# Patient Record
Sex: Male | Born: 1945 | Hispanic: No | Marital: Married | Smoking: Never smoker
Health system: Southern US, Community
[De-identification: ages and names within clinical notes are randomized; demographics above are authoritative.]

## PROBLEM LIST (undated history)

## (undated) DIAGNOSIS — I10 Essential (primary) hypertension: Secondary | ICD-10-CM

## (undated) DIAGNOSIS — E119 Type 2 diabetes mellitus without complications: Secondary | ICD-10-CM

## (undated) DIAGNOSIS — G43909 Migraine, unspecified, not intractable, without status migrainosus: Secondary | ICD-10-CM

## (undated) DIAGNOSIS — N451 Epididymitis: Secondary | ICD-10-CM

## (undated) DIAGNOSIS — F4310 Post-traumatic stress disorder, unspecified: Secondary | ICD-10-CM

## (undated) DIAGNOSIS — E7800 Pure hypercholesterolemia, unspecified: Secondary | ICD-10-CM

## (undated) HISTORY — PX: CATARACT EXTRACTION: SUR2

## (undated) HISTORY — PX: INGUINAL HERNIA REPAIR: SUR1180

## (undated) HISTORY — PX: TONSILLECTOMY: SUR1361

## (undated) HISTORY — PX: PROSTATE BIOPSY: SHX241

---

## 2007-12-15 HISTORY — PX: LUMBAR SPINE SURGERY: SHX701

## 2013-12-14 HISTORY — PX: LUMBAR SPINE SURGERY: SHX701

## 2018-02-13 DIAGNOSIS — N451 Epididymitis: Secondary | ICD-10-CM

## 2018-02-13 HISTORY — DX: Epididymitis: N45.1

## 2018-04-04 ENCOUNTER — Emergency Department (HOSPITAL_BASED_OUTPATIENT_CLINIC_OR_DEPARTMENT_OTHER): Payer: Medicare Other

## 2018-04-04 ENCOUNTER — Emergency Department (HOSPITAL_BASED_OUTPATIENT_CLINIC_OR_DEPARTMENT_OTHER)
Admission: EM | Admit: 2018-04-04 | Discharge: 2018-04-04 | Disposition: A | Discharge status: Home / Self Care | Payer: Medicare Other | Attending: Emergency Medicine | Admitting: Emergency Medicine

## 2018-04-04 ENCOUNTER — Other Ambulatory Visit: Payer: Self-pay

## 2018-04-04 ENCOUNTER — Encounter (HOSPITAL_BASED_OUTPATIENT_CLINIC_OR_DEPARTMENT_OTHER): Payer: Self-pay | Admitting: *Deleted

## 2018-04-04 DIAGNOSIS — I10 Essential (primary) hypertension: Secondary | ICD-10-CM | POA: Diagnosis not present

## 2018-04-04 DIAGNOSIS — Z7984 Long term (current) use of oral hypoglycemic drugs: Secondary | ICD-10-CM | POA: Diagnosis not present

## 2018-04-04 DIAGNOSIS — E119 Type 2 diabetes mellitus without complications: Secondary | ICD-10-CM | POA: Diagnosis not present

## 2018-04-04 DIAGNOSIS — R1032 Left lower quadrant pain: Secondary | ICD-10-CM | POA: Insufficient documentation

## 2018-04-04 DIAGNOSIS — R1030 Lower abdominal pain, unspecified: Secondary | ICD-10-CM | POA: Diagnosis present

## 2018-04-04 DIAGNOSIS — Z79899 Other long term (current) drug therapy: Secondary | ICD-10-CM | POA: Insufficient documentation

## 2018-04-04 HISTORY — DX: Type 2 diabetes mellitus without complications: E11.9

## 2018-04-04 HISTORY — DX: Epididymitis: N45.1

## 2018-04-04 HISTORY — DX: Pure hypercholesterolemia, unspecified: E78.00

## 2018-04-04 HISTORY — DX: Essential (primary) hypertension: I10

## 2018-04-04 LAB — URINALYSIS, ROUTINE W REFLEX MICROSCOPIC
BILIRUBIN URINE: NEGATIVE
GLUCOSE, UA: NEGATIVE mg/dL
Hgb urine dipstick: NEGATIVE
KETONES UR: NEGATIVE mg/dL
Leukocytes, UA: NEGATIVE
Nitrite: NEGATIVE
Protein, ur: NEGATIVE mg/dL
Specific Gravity, Urine: 1.02 (ref 1.005–1.030)
pH: 6 (ref 5.0–8.0)

## 2018-04-04 LAB — COMPREHENSIVE METABOLIC PANEL
ALT: 30 U/L (ref 17–63)
ANION GAP: 8 (ref 5–15)
AST: 30 U/L (ref 15–41)
Albumin: 4 g/dL (ref 3.5–5.0)
Alkaline Phosphatase: 61 U/L (ref 38–126)
BILIRUBIN TOTAL: 0.5 mg/dL (ref 0.3–1.2)
BUN: 14 mg/dL (ref 6–20)
CO2: 24 mmol/L (ref 22–32)
Calcium: 9.4 mg/dL (ref 8.9–10.3)
Chloride: 102 mmol/L (ref 101–111)
Creatinine, Ser: 0.81 mg/dL (ref 0.61–1.24)
GLUCOSE: 133 mg/dL — AB (ref 65–99)
POTASSIUM: 3.4 mmol/L — AB (ref 3.5–5.1)
Sodium: 134 mmol/L — ABNORMAL LOW (ref 135–145)
TOTAL PROTEIN: 6.6 g/dL (ref 6.5–8.1)

## 2018-04-04 LAB — CBC WITH DIFFERENTIAL/PLATELET
BASOS PCT: 0 %
Basophils Absolute: 0 10*3/uL (ref 0.0–0.1)
Eosinophils Absolute: 0.1 10*3/uL (ref 0.0–0.7)
Eosinophils Relative: 1 %
HEMATOCRIT: 37.2 % — AB (ref 39.0–52.0)
Hemoglobin: 13 g/dL (ref 13.0–17.0)
LYMPHS ABS: 1.8 10*3/uL (ref 0.7–4.0)
Lymphocytes Relative: 24 %
MCH: 30.4 pg (ref 26.0–34.0)
MCHC: 34.9 g/dL (ref 30.0–36.0)
MCV: 87.1 fL (ref 78.0–100.0)
MONO ABS: 0.9 10*3/uL (ref 0.1–1.0)
MONOS PCT: 13 %
NEUTROS ABS: 4.6 10*3/uL (ref 1.7–7.7)
Neutrophils Relative %: 62 %
Platelets: 198 10*3/uL (ref 150–400)
RBC: 4.27 MIL/uL (ref 4.22–5.81)
RDW: 13.9 % (ref 11.5–15.5)
WBC: 7.3 10*3/uL (ref 4.0–10.5)

## 2018-04-04 NOTE — ED Provider Notes (Signed)
[facility] EMERGENCY DEPARTMENT Provider Note   CSN: [id number] Arrival date & time: 04/04/18  1341     History   Chief Complaint Chief Complaint  Patient presents with  . Groin Pain    HPI Arthur Spencer is a 72 y.o. male.  HPI   Arthur Spencer is a 72 y.o. male, with a history of DM and HTN, presenting to the ED with left testicular pain beginning about a week and a half ago.  Pain shoots from the left inguinal region into the left testicle and vice versa, intermittent, ranges from 3/10-5/10, arises with movement, getting up out of bed in the morning, or lifting.  Patient had similar pain around February 13, 2018.  Epididymitis was diagnosed via ultrasound and patient was treated with "10-14 days of antibiotics of some sort." Patient recently moved from [place] and has been lifting boxes for the past couple weeks.  States, "I do not know if this is a recurrence of the epididymitis or if I hurt myself lifting, like a hernia."  He does endorse some intermittent pain with bowel movements.  Denies difficulty urinating, dysuria, hematuria, penile discharge, nausea/vomiting, abdominal pain, hematochezia/melena, or any other complaints.  Past Medical History:  Diagnosis Date  . Diabetes mellitus without complication (HCC)   . Epididymitis 02/13/2018  . High cholesterol   . Hypertension     There are no active problems to display for this patient.   Past Surgical History:  Procedure Laterality Date  . INGUINAL HERNIA REPAIR     As infant  . LUMBAR SPINE SURGERY  2009   discectomy  . LUMBAR SPINE SURGERY  2015   enlarged nerve canal   . TONSILLECTOMY          Home Medications    Prior to Admission medications   Medication Sig Start Date End Date Taking? Authorizing Provider  ezetimibe (ZETIA) 10 MG tablet Take 10 mg by mouth daily.   Yes [provider]  hydrochlorothiazide (MICROZIDE) 12.5 MG capsule Take 12.5 mg by mouth daily.   Yes [provider]  lisinopril (PRINIVIL,ZESTRIL) 10 MG tablet Take 10 mg by mouth daily.   Yes [provider]  metFORMIN (GLUCOPHAGE) 500 MG tablet Take by mouth 2 (two) times daily with a meal.   Yes [provider]    Family History No family history on file.  Social History Social History   Tobacco Use  . Smoking status: Never Smoker  . Smokeless tobacco: Never Used  Substance Use Topics  . Alcohol use: Not Currently    Frequency: Never  . Drug use: Never     Allergies   Patient has no known allergies.   Review of Systems Review of Systems  Constitutional: Negative for chills and fever.  Gastrointestinal: Negative for abdominal pain, blood in stool, nausea and vomiting.  Genitourinary: Positive for testicular pain. Negative for difficulty urinating, discharge, dysuria, frequency, genital sores, hematuria, penile swelling and scrotal swelling.  Musculoskeletal: Negative for back pain.  All other systems reviewed and are negative.    Physical Exam Updated Vital Signs BP (!) 172/66   Pulse 60   Temp 98 F (36.7 C) (Oral)   Resp 20   Ht 6\' 1"  (1.854 m)   Wt 106.6 kg (235 lb)   SpO2 98%   BMI 31.00 kg/m   Physical Exam  Constitutional: He appears well-developed and well-nourished. No distress.  HENT:  Head: Normocephalic and atraumatic.  Eyes: Conjunctivae are normal.  Neck:  Neck supple.  Cardiovascular: Normal rate, regular rhythm, normal heart sounds and intact distal pulses.  Pulmonary/Chest: Effort normal and breath sounds normal. No respiratory distress.  Abdominal: Soft. There is no tenderness. There is no guarding.  Genitourinary:  Genitourinary Comments: Penis, scrotum, and testicles without swelling, lesions, or tenderness. No penile discharge.  No inguinal hernia noted. Cremasteric reflex intact. No inguinal lymphadenopathy. Overall normal male genitalia.   No external hemorrhoids, fissures, or lesions noted. No gross blood or stool  burden. No rectal tenderness.  No noted prostate tenderness.  RN, Misty, served as Biomedical engineerchaperone during the rectal exam.  Musculoskeletal: He exhibits no edema.  Lymphadenopathy:    He has no cervical adenopathy.  Neurological: He is alert.  Skin: Skin is warm and dry. He is not diaphoretic.  Psychiatric: He has a normal mood and affect. His behavior is normal.  Nursing note and vitals reviewed.    ED Treatments / Results  Labs (all labs ordered are listed, but only abnormal results are displayed) Labs Reviewed  CBC WITH DIFFERENTIAL/PLATELET - Abnormal; Notable for the following components:      Result Value   HCT 37.2 (*)    All other components within normal limits  COMPREHENSIVE METABOLIC PANEL - Abnormal; Notable for the following components:   Sodium 134 (*)    Potassium 3.4 (*)    Glucose, Bld 133 (*)    All other components within normal limits  URINALYSIS, ROUTINE W REFLEX MICROSCOPIC  GC/CHLAMYDIA PROBE AMP ([place]) NOT [facility]    EKG None  Radiology [place] Scrotum W/doppler  Result Date: 04/04/2018 CLINICAL DATA:  Left-sided groin and testicular pain for the past 10 days. Patient had an episode of epididymitis 1 month ago and underwent ultrasound exam then and subsequent treatment. Onset of the current pain began after lifting boxes. EXAM: SCROTAL ULTRASOUND DOPPLER ULTRASOUND OF THE TESTICLES TECHNIQUE: Complete ultrasound examination of the testicles, epididymis, and other scrotal structures was performed. Color and spectral Doppler ultrasound were also utilized to evaluate blood flow to the testicles. COMPARISON:  None available in PACs FINDINGS: Right testicle Measurements: 4.9 x 2.7 x 3.2 cm. No mass or microlithiasis visualized. Left testicle Measurements: 4.8 x 2.8 x 2.8 cm. No mass or microlithiasis visualized. Right epididymis: The right epididymis is normal in echotexture and vascularity. Left epididymis: The left epididymis demonstrates tiny cystic structures  measuring 4 cm in diameter or less. Vascularity appears normal. Hydrocele:  There small bilateral hydroceles Varicocele:  None visualized. Pulsed Doppler interrogation of both testes demonstrates normal low resistance arterial and venous waveforms bilaterally. IMPRESSION: Normal appearance of the testes. No evidence of orchitis or torsion. Subcentimeter cystic changes in the left epididymis without hypervascularity. Normal appearing right epididymis. Small bilateral hydroceles. No hernia sac demonstrated within the scrotum. Electronically Signed   By: David  [place] M.D.   On: 04/04/2018 15:44          Procedures Procedures (including critical care time)  Medications Ordered in ED Medications - No data to display   Initial Impression / Assessment and Plan / ED Course  I have reviewed the triage vital signs and the nursing notes.  Pertinent labs & imaging results that were available during my care of the patient were reviewed by me and considered in my medical decision making (see chart for details).     Patient presents with testicular and inguinal discomfort. Patient is nontoxic appearing, afebrile, not tachycardic, not tachypneic, not hypotensive, and is in no apparent distress.  No  acute abnormality on ultrasound.  No hernia appreciated on exam.  Lab results reassuring.  Urology follow-up. The patient was given instructions for home care as well as return precautions. Patient voices understanding of these instructions, accepts the plan, and is comfortable with discharge.    Findings and plan of care discussed with Loren Racer, MD.    Final Clinical Impressions(s) / ED Diagnoses   Final diagnoses:  Left inguinal pain    ED Discharge Orders    None       Concepcion Living 04/04/18 1625    Loren Racer, MD 04/05/18 [phone]

## 2018-04-04 NOTE — ED Triage Notes (Signed)
Recent sinus infection. He was treated for epididymitis a month ago. He had an US at that time. Here today for c/o pain in his groin after lifting boxes. He feels he has developed a hernia.

## 2018-04-04 NOTE — Discharge Instructions (Addendum)
There were no acute abnormalities noted on the ultrasound, including no signs of epididymitis or hernia. Antiinflammatory medications: Take 400 mg of ibuprofen every 6 hours for the next 3 days. May alternatively use naproxen following the dosage guidelines on the bottle. After this time, these medications may be used as needed for pain. Take these medications with food to avoid upset stomach. Choose only one of these medications, do not take them together. Tylenol: Should you continue to have additional pain while taking the ibuprofen or naproxen, you may add in tylenol as needed. Your daily total maximum amount of tylenol from all sources should be limited to 4000mg /day for persons without liver problems, or 2000mg /day for those with liver problems. Follow-up: Follow-up with the urologist as soon as possible on this matter.  Call the number provided to set up an appointment. Return: Return to the ED for worsening symptoms.

## 2018-09-18 ENCOUNTER — Emergency Department (HOSPITAL_BASED_OUTPATIENT_CLINIC_OR_DEPARTMENT_OTHER)
Admission: EM | Admit: 2018-09-18 | Discharge: 2018-09-18 | Disposition: A | Discharge status: Home / Self Care | Payer: Medicare Other | Attending: Emergency Medicine | Admitting: Emergency Medicine

## 2018-09-18 ENCOUNTER — Other Ambulatory Visit: Payer: Self-pay

## 2018-09-18 ENCOUNTER — Encounter (HOSPITAL_BASED_OUTPATIENT_CLINIC_OR_DEPARTMENT_OTHER): Payer: Self-pay | Admitting: Emergency Medicine

## 2018-09-18 DIAGNOSIS — E119 Type 2 diabetes mellitus without complications: Secondary | ICD-10-CM | POA: Insufficient documentation

## 2018-09-18 DIAGNOSIS — Z79899 Other long term (current) drug therapy: Secondary | ICD-10-CM | POA: Insufficient documentation

## 2018-09-18 DIAGNOSIS — R55 Syncope and collapse: Secondary | ICD-10-CM | POA: Diagnosis present

## 2018-09-18 DIAGNOSIS — Z7984 Long term (current) use of oral hypoglycemic drugs: Secondary | ICD-10-CM | POA: Insufficient documentation

## 2018-09-18 DIAGNOSIS — I10 Essential (primary) hypertension: Secondary | ICD-10-CM | POA: Insufficient documentation

## 2018-09-18 LAB — BASIC METABOLIC PANEL
ANION GAP: 8 (ref 5–15)
BUN: 16 mg/dL (ref 8–23)
CO2: 27 mmol/L (ref 22–32)
Calcium: 9.4 mg/dL (ref 8.9–10.3)
Chloride: 103 mmol/L (ref 98–111)
Creatinine, Ser: 0.92 mg/dL (ref 0.61–1.24)
GFR calc Af Amer: >60 mL/min (ref >60)
GLUCOSE: 101 mg/dL — AB (ref 70–99)
POTASSIUM: 4.6 mmol/L (ref 3.5–5.1)
Sodium: 138 mmol/L (ref 135–145)

## 2018-09-18 LAB — CBC
HEMATOCRIT: 38.4 % — AB (ref 39.0–52.0)
HEMOGLOBIN: 13 g/dL (ref 13.0–17.0)
MCH: 30 pg (ref 26.0–34.0)
MCHC: 33.9 g/dL (ref 30.0–36.0)
MCV: 88.7 fL (ref 78.0–100.0)
Platelets: 185 10*3/uL (ref 150–400)
RBC: 4.33 MIL/uL (ref 4.22–5.81)
RDW: 14.3 % (ref 11.5–15.5)
WBC: 6.4 10*3/uL (ref 4.0–10.5)

## 2018-09-18 LAB — URINALYSIS, ROUTINE W REFLEX MICROSCOPIC
Bilirubin Urine: NEGATIVE
GLUCOSE, UA: NEGATIVE mg/dL
Hgb urine dipstick: NEGATIVE
KETONES UR: NEGATIVE mg/dL
Leukocytes, UA: NEGATIVE
Nitrite: NEGATIVE
Protein, ur: NEGATIVE mg/dL
pH: 5.5 (ref 5.0–8.0)

## 2018-09-18 LAB — TROPONIN I: Troponin I: <0.03 ng/mL (ref <0.03)

## 2018-09-18 NOTE — ED Provider Notes (Signed)
[facility] EMERGENCY DEPARTMENT Provider Note   CSN: [id number] Arrival date & time: 09/18/18  1016   History   Chief Complaint Chief Complaint  Patient presents with  . Dizziness    HPI Arthur Spencer is a 72 y.o. male with history of diabetes mellitus, hypertension, and hyperlipidemia who presents to the ED with complaints of a syncopal episodes 4 days prior. Patient states that he is fairly active and typically rides his bike or walks several miles daily. He states that this Thursday he went for a 7 mile bike ride and towards the end of the workout he began to feel nauseated with diaphoresis with his fairly typical dyspnea related to intense exercise. He states he got off of his bike and sat down on a box on the side of the road and began to feel very dizzy/lightheaded and subsequently passed out. He is unsure how long he lost consciousness for, but he felt better when he woke up. He states he walked home and the remainder of the day was fairly normal for him. He exercised the subsequent days without difficulty. This morning he woke up and after getting out of bed he began to feel somewhat poorly again, he became diaphoretic/clammy with mild lightheadedness. He states he passed a few loose stools and felt somewhat better, but felt that he should come get checked out. At present he is feeling fairly normal. He has not had any chest pain throughout the past several days. He has not had any subsequent syncope episodes. Denies fever, chills, vomiting, diarrhea, blood in stool, or abdominal pain. Denies leg pain/swelling, hemoptysis, recent surgery/trauma, recent long travel, hormone use, personal hx of cancer, or hx of DVT/PE.    HPI  Past Medical History:  Diagnosis Date  . Diabetes mellitus without complication (HCC)   . Epididymitis 02/13/2018  . High cholesterol   . Hypertension     There are no active problems to display for this patient.   Past Surgical History:  Procedure  Laterality Date  . INGUINAL HERNIA REPAIR     As infant  . LUMBAR SPINE SURGERY  2009   discectomy  . LUMBAR SPINE SURGERY  2015   enlarged nerve canal   . TONSILLECTOMY          Home Medications    Prior to Admission medications   Medication Sig Start Date End Date Taking? Authorizing Provider  ezetimibe (ZETIA) 10 MG tablet Take 10 mg by mouth daily.    [provider]  hydrochlorothiazide (MICROZIDE) 12.5 MG capsule Take 12.5 mg by mouth daily.    [provider]  lisinopril (PRINIVIL,ZESTRIL) 10 MG tablet Take 10 mg by mouth daily.    [provider]  metFORMIN (GLUCOPHAGE) 500 MG tablet Take by mouth 2 (two) times daily with a meal.    [provider]    Family History No family history on file.  Social History Social History   Tobacco Use  . Smoking status: Never Smoker  . Smokeless tobacco: Never Used  Substance Use Topics  . Alcohol use: Not Currently    Frequency: Never  . Drug use: Never     Allergies   Patient has no known allergies.   Review of Systems Review of Systems  Constitutional: Negative for chills and fever.  Respiratory: Positive for shortness of breath (prior to syncope- states fairly typical with his exercise).   Cardiovascular: Negative for chest pain, palpitations and leg swelling.  Gastrointestinal: Positive for nausea. Negative  for abdominal pain, blood in stool, diarrhea and vomiting.  Neurological: Positive for dizziness, syncope and light-headedness. Negative for weakness and numbness.  All other systems reviewed and are negative.  Physical Exam Updated Vital Signs BP (!) 139/48 (BP Location: Right Arm)   Pulse 96   Temp 97.9 F (36.6 C) (Oral)   Resp 18   Ht 6\' 1"  (1.854 m)   Wt 106.1 kg   SpO2 100%   BMI 30.87 kg/m   Physical Exam  Constitutional: He appears well-developed and well-nourished.  Non-toxic appearance. No distress.  HENT:  Head: Normocephalic and atraumatic.  Eyes:  Conjunctivae are normal. Right eye exhibits no discharge. Left eye exhibits no discharge.  Neck: Neck supple.  Cardiovascular: Normal rate and regular rhythm.  No murmur heard. Pulses:      Radial pulses are 2+ on the right side, and 2+ on the left side.       Dorsalis pedis pulses are 2+ on the right side, and 2+ on the left side.       Posterior tibial pulses are 2+ on the right side, and 2+ on the left side.  Pulmonary/Chest: Effort normal and breath sounds normal. No respiratory distress. He has no wheezes. He has no rhonchi. He has no rales.  Respiration even and unlabored  Abdominal: Soft. He exhibits no distension. There is no tenderness.  Musculoskeletal: He exhibits edema (trace symmetric to lower legs).  Neurological: He is alert.  Alert. Clear speech. No facial droop. CNIII-XII grossly intact. Bilateral upper and lower extremities' sensation grossly intact. 5/5 symmetric strength with grip strength and with plantar and dorsi flexion bilaterally.. Normal finger to nose bilaterally. Negative pronator drift. Negative romberg.   Skin: Skin is warm and dry. No rash noted.  Psychiatric: He has a normal mood and affect. His behavior is normal.  Nursing note and vitals reviewed.    ED Treatments / Results  Labs Results for orders placed or performed during the hospital encounter of 09/18/18  Basic metabolic panel  Result Value Ref Range   Sodium 138 135 - 145 mmol/L   Potassium 4.6 3.5 - 5.1 mmol/L   Chloride 103 98 - 111 mmol/L   CO2 27 22 - 32 mmol/L   Glucose, Bld 101 (H) 70 - 99 mg/dL   BUN 16 8 - 23 mg/dL   Creatinine, Ser 1.61 0.61 - 1.24 mg/dL   Calcium 9.4 8.9 - 09.6 mg/dL   GFR calc non Af Amer >60 >60 mL/min   GFR calc Af Amer >60 >60 mL/min   Anion gap 8 5 - 15  CBC  Result Value Ref Range   WBC 6.4 4.0 - 10.5 K/uL   RBC 4.33 4.22 - 5.81 MIL/uL   Hemoglobin 13.0 13.0 - 17.0 g/dL   HCT 04.5 (L) 40.9 - 81.1 %   MCV 88.7 78.0 - 100.0 fL   MCH 30.0 26.0 - 34.0 pg     MCHC 33.9 30.0 - 36.0 g/dL   RDW 91.4 78.2 - 95.6 %   Platelets 185 150 - 400 K/uL  Urinalysis, Routine w reflex microscopic  Result Value Ref Range   Color, Urine YELLOW YELLOW   APPearance CLEAR CLEAR   Specific Gravity, Urine <1.005 (L) 1.005 - 1.030   pH 5.5 5.0 - 8.0   Glucose, UA NEGATIVE NEGATIVE mg/dL   Hgb urine dipstick NEGATIVE NEGATIVE   Bilirubin Urine NEGATIVE NEGATIVE   Ketones, ur NEGATIVE NEGATIVE mg/dL   Protein, ur NEGATIVE NEGATIVE mg/dL  Nitrite NEGATIVE NEGATIVE   Leukocytes, UA NEGATIVE NEGATIVE  Troponin I  Result Value Ref Range   Troponin I <0.03 <0.03 ng/mL   No results found.  EKG EKG Interpretation  Date/Time:  Sunday September 18 2018 10:52:42 EDT Ventricular Rate:  65 PR Interval:    QRS Duration: 109 QT Interval:  442 QTC Calculation: 460 R Axis:   -66 Text Interpretation:  Sinus rhythm Left anterior fascicular block Low voltage, precordial leads Consider anterior infarct Borderline ST depression, anterolateral leads No old tracing to compare Confirmed by Derwood Kaplan [phone]) on 09/18/2018 11:10:21 AM   Radiology No results found.  Procedures Procedures (including critical care time)  Medications Ordered in ED Medications - No data to display   Initial Impression / Assessment and Plan / ED Course  I have reviewed the triage vital signs and the nursing notes.  Pertinent labs & imaging results that were available during my care of the patient were reviewed by me and considered in my medical decision making (see chart for details).   Patient presents to the ED s/p syncopal episode a few days prior with somewhat similar prodromal sxs this AM without actual syncope today, feeling fairly back to baseline upon my evaluation. Patient nontoxic appearing, in no apparent distress, vitals without significant abnormality- mild bradycardia and mild hypertension at times, do not suspect HTN emergency. Patient has a benign physical exam without  focal neurologic deficits to suggest acute ischemic/hemorrhagic CVA as etiology. His work-up in the ER has been overall reassuring- no evidence of anemia, significant electrolyte disturbance, leukocytosis, or dehydration. His EKG does not reveal a STEMI and his delta troponin is negative, therefore doubt ACS, especially in setting of lack of chest pain and asymptomatic exercise since syncope, however would benefit from cardiology follow up. Patient is low risk wells, do not suspect pulmonary embolism. There is concern for possible arrhythmia leading to his syncopal episode, no arrhythmia appreciated on cardiac monitor throughout ER stay. Feel it is important patient follow up closely with cardiology. He appears hemodynamically stable and safe for discharge at this time. I discussed results, treatment plan, need for follow-up, and return precautions with the patient. Provided opportunity for questions, patient confirmed understanding and is in agreement with plan.   Findings and plan of care discussed with supervising physician Dr. Rhunette Croft who personally evaluated and examined this patient and is in agreement.   Final Clinical Impressions(s) / ED Diagnoses   Final diagnoses:  Syncope, unspecified syncope type    ED Discharge Orders    None       Cherly Anderson, PA-C 09/18/18 1953    Derwood Kaplan, MD 09/22/18 1550

## 2018-09-18 NOTE — Discharge Instructions (Addendum)
You were seen in the emergency department today for an episode of passing out earlier this week and symptoms of dizziness/lightheadedness and clamminess earlier today. Your work-up here has been overall reassuring. Your lab work does not show significant problems with your blood counts or electrolyte. Your urine does not show dehydration. The enzyme we use to check your heart does not show signs of an acute heart attack.   We would like you to follow up closely with a cardiologist regarding your symptoms for possible further testing. Please call to make an appointment in the next 3-5 days. Return to the ER for new or worsening symptoms including but not limited to chest pain, passing out again, trouble breathing, inability to keep fluids down, or any other concerns.

## 2018-09-18 NOTE — ED Notes (Signed)
ED Provider at bedside. 

## 2018-09-18 NOTE — ED Triage Notes (Signed)
Pt reports he was cycling on Thursday. He began feeling nauseated and dizzy. Pt states the next thing he remembers was waking up on the ground. States he felt fine after that all weekend until this morning when he felt dizzy and diaphoretic again. Denies pain.

## 2018-09-23 ENCOUNTER — Encounter: Payer: Self-pay | Admitting: Cardiology

## 2018-09-26 ENCOUNTER — Encounter: Payer: Self-pay | Admitting: Cardiology

## 2018-09-26 ENCOUNTER — Ambulatory Visit (INDEPENDENT_AMBULATORY_CARE_PROVIDER_SITE_OTHER): Payer: Medicare Other | Admitting: Cardiology

## 2018-09-26 VITALS — BP 156/70 | HR 56 | Ht 73.0 in | Wt 244.8 lb

## 2018-09-26 DIAGNOSIS — I10 Essential (primary) hypertension: Secondary | ICD-10-CM | POA: Diagnosis not present

## 2018-09-26 DIAGNOSIS — E1100 Type 2 diabetes mellitus with hyperosmolarity without nonketotic hyperglycemic-hyperosmolar coma (NKHHC): Secondary | ICD-10-CM | POA: Diagnosis not present

## 2018-09-26 DIAGNOSIS — R55 Syncope and collapse: Secondary | ICD-10-CM | POA: Diagnosis not present

## 2018-09-26 DIAGNOSIS — E119 Type 2 diabetes mellitus without complications: Secondary | ICD-10-CM | POA: Insufficient documentation

## 2018-09-26 MED ORDER — LISINOPRIL 20 MG PO TABS: 20.0000 mg | ORAL_TABLET | Freq: Every day | ORAL | 3 refills | Status: DC

## 2018-09-26 NOTE — Progress Notes (Signed)
Cardiology Office Note    Date:  09/26/2018   ID:  Ralphael Southgate, DOB [birth year], MRN [id number]  PCP:  System, Pcp Not In  Cardiologist:  Armanda Magic, MD   Chief Complaint  Patient presents with  . New Patient (Initial Visit)    Syncope    History of Present Illness:  Arthur Spencer is a 72 y.o. male who is being seen today for the evaluation of syncope at the request of Derwood Kaplan, MD.  This is a 72yo male with a history of DM, hyperlipidemia and HTN.  He recently was seen in the ER 09/18/2018 for dizziness/syncope.  He is very active and rides a bike and walks several miles daily.  Last week he went for a 7 mile bike ride and at the end of the ride became nauseated and diaphoretic.  He was also SOB but this is not atypical for him when exerts himself significantly.  He got off his bike and sat down and got very dizzy and passed out.  When he got up he felt better and walked home and felt fine the rest of the day. He says that that morning is was hot out and very humid.  He does not know how long he was out for.  A few days later he got up and started to feel diaphoretic after getting out of bed with lightheadedness.  He passed a few loose stools and then decided to go to the ER for evaluation.  In ER workup was normal.  He has not had any syncope or other sx since then.     He denies any chest pain or pressure, SOB, DOE, PND, orthopnea, LE edema or palpitations . He is compliant with his meds and is tolerating meds with no SE.      Past Medical History:  Diagnosis Date  . Diabetes mellitus without complication (HCC)   . Epididymitis 02/13/2018  . High cholesterol   . Hypertension     Past Surgical History:  Procedure Laterality Date  . INGUINAL HERNIA REPAIR     As infant  . LUMBAR SPINE SURGERY  2009   discectomy  . LUMBAR SPINE SURGERY  2015   enlarged nerve canal   . TONSILLECTOMY      Current Medications: Current Meds  Medication Sig  . ezetimibe (ZETIA) 10 MG  tablet Take 10 mg by mouth daily.  . hydrochlorothiazide (MICROZIDE) 12.5 MG capsule Take 12.5 mg by mouth daily.  Marland Kitchen lisinopril (PRINIVIL,ZESTRIL) 10 MG tablet Take 10 mg by mouth daily.  . metFORMIN (GLUCOPHAGE) 500 MG tablet Take by mouth 2 (two) times daily with a meal.    Allergies:   Patient has no known allergies.   Social History   Socioeconomic History  . Marital status: Married    Spouse name: Not on file  . Number of children: Not on file  . Years of education: Not on file  . Highest education level: Not on file  Occupational History  . Not on file  Social Needs  . Financial resource strain: Not on file  . Food insecurity:    Worry: Not on file    Inability: Not on file  . Transportation needs:    Medical: Not on file    Non-medical: Not on file  Tobacco Use  . Smoking status: Never Smoker  . Smokeless tobacco: Never Used  Substance and Sexual Activity  . Alcohol use: Not Currently    Frequency: Never  . Drug use:  Never  . Sexual activity: Not on file  Lifestyle  . Physical activity:    Days per week: Not on file    Minutes per session: Not on file  . Stress: Not on file  Relationships  . Social connections:    Talks on phone: Not on file    Gets together: Not on file    Attends religious service: Not on file    Active member of club or organization: Not on file    Attends meetings of clubs or organizations: Not on file    Relationship status: Not on file  Other Topics Concern  . Not on file  Social History Narrative  . Not on file     Family History:  The patient's family history is not on file.   ROS:   Please see the history of present illness.    ROS All other systems reviewed and are negative.  No flowsheet data found.  PHYSICAL EXAM:   VS:  BP (!) 156/70   Pulse (!) 56   Ht 6\' 1"  (1.854 m)   Wt 244 lb 12.8 oz (111 kg)   SpO2 98%   BMI 32.30 kg/m    GEN: Well nourished, well developed, in no acute distress  HEENT: normal  Neck: no  JVD, carotid bruits, or masses Cardiac: RRR; no murmurs, rubs, or gallops,no edema.  Intact distal pulses bilaterally.  Respiratory:  clear to auscultation bilaterally, normal work of breathing GI: soft, nontender, nondistended, + BS MS: no deformity or atrophy  Skin: warm and dry, no rash Neuro:  Alert and Oriented x 3, Strength and sensation are intact Psych: euthymic mood, full affect  Wt Readings from Last 3 Encounters:  09/26/18 244 lb 12.8 oz (111 kg)  09/18/18 234 lb (106.1 kg)  04/04/18 235 lb (106.6 kg)      Studies/Labs Reviewed:   EKG:  EKG is not ordered today.  Recent Labs: 04/04/2018: ALT 30 09/18/2018: BUN 16; Creatinine, Ser 0.92; Hemoglobin 13.0; Platelets 185; Potassium 4.6; Sodium 138   Lipid Panel No results found for: CHOL, TRIG, HDL, CHOLHDL, VLDL, LDLCALC, LDLDIRECT  Additional studies/ records that were reviewed today include:      ASSESSMENT:    1. Syncope, unspecified syncope type   2. Benign essential HTN   3. Type 2 diabetes mellitus with hyperosmolarity without coma, without long-term current use of insulin (HCC)      PLAN:  In order of problems listed above:  1.  Syncope - suspect this was related to dehydration and orthostatic hypotension.  It was a hot day out and humid and he is also on a diuretic.  Orthostatic BPs are normal in the office. I encouraged him to make sure he stays well hydrated and to avoid exercise on hot humid days.   I have recommended that we stop his HCTZ and increase Lisinopril to 20mg  daily.   His EKG showed NSR with LAFB and normal QTc.  I will get a stress myoview to rule out ischemia and echo to assess LVF,.  I have also recommended an event monitor to assess for arrhythmias.   2.  HTN - BP is controlled on exam today.  I have recommended stopping diuretic and increasing Lisinopril to 20mg  daily.  I have asked him to check his BP daily for a week and call with the results.   3.  Type 2 DM - this is followed by his  PCP.      Medication Adjustments/Labs and  Tests Ordered: Current medicines are reviewed at length with the patient today.  Concerns regarding medicines are outlined above.  Medication changes, Labs and Tests ordered today are listed in the Patient Instructions below.  There are no Patient Instructions on file for this visit.   Signed, Armanda Magic, MD  09/26/2018 1:54 PM    [facility] [address] [place], [place], [place]  [address] Phone: [phone]; Fax: [phone]

## 2018-09-26 NOTE — Patient Instructions (Signed)
Medication Instructions:   Stop: Hydrochlorothiazide  Increase: Lisinopril 20 mg, daily    If you need a refill on your cardiac medications before your next appointment, please call your pharmacy.   Lab work: None  If you have labs (blood work) drawn today and your tests are completely normal, you will receive your results only by: Marland Kitchen MyChart Message (if you have MyChart) OR . A paper copy in the mail If you have any lab test that is abnormal or we need to change your treatment, we will call you to review the results.  Testing/Procedures: Your physician has recommended that you wear an event monitor. Event monitors are medical devices that record the heart's electrical activity. Doctors most often [place] these monitors to diagnose arrhythmias. Arrhythmias are problems with the speed or rhythm of the heartbeat. The monitor is a small, portable device. You can wear one while you do your normal daily activities. This is usually used to diagnose what is causing palpitations/syncope (passing out).  Your physician has requested that you have en exercise stress myoview. For further information please visit [email or web address] Please follow instruction sheet, as given.  Your physician has requested that you have an echocardiogram. Echocardiography is a painless test that uses sound waves to create images of your heart. It provides your doctor with information about the size and shape of your heart and how well your heart's chambers and valves are working. This procedure takes approximately one hour. There are no restrictions for this procedure.  Follow-Up: As needed  Any Other Special Instructions Will Be Listed Below (If Applicable).  Blood Pressure: 1 week then call the office with the results.

## 2018-10-04 ENCOUNTER — Telehealth (HOSPITAL_COMMUNITY): Payer: Self-pay

## 2018-10-04 NOTE — Telephone Encounter (Signed)
Pt contacted and detailed instructions were given. Pt stated he understood and would be here. S.Chantilly Linskey EMTP

## 2018-10-06 ENCOUNTER — Ambulatory Visit (HOSPITAL_BASED_OUTPATIENT_CLINIC_OR_DEPARTMENT_OTHER): Payer: Medicare Other

## 2018-10-06 ENCOUNTER — Other Ambulatory Visit (HOSPITAL_COMMUNITY): Payer: Federal, State, Local not specified - PPO

## 2018-10-06 ENCOUNTER — Ambulatory Visit (INDEPENDENT_AMBULATORY_CARE_PROVIDER_SITE_OTHER): Payer: Medicare Other

## 2018-10-06 ENCOUNTER — Ambulatory Visit (HOSPITAL_COMMUNITY): Payer: Medicare Other | Attending: Cardiology

## 2018-10-06 ENCOUNTER — Other Ambulatory Visit: Payer: Self-pay

## 2018-10-06 DIAGNOSIS — R55 Syncope and collapse: Secondary | ICD-10-CM

## 2018-10-06 LAB — MYOCARDIAL PERFUSION IMAGING
CHL CUP NUCLEAR SRS: 1
CHL RATE OF PERCEIVED EXERTION: 19
CSEPEW: 10.1 METS
CSEPPHR: 125 {beats}/min
Exercise duration (min): 8 min
Exercise duration (sec): 45 s
LVDIAVOL: 113 mL (ref 62–150)
LVSYSVOL: 52 mL
MPHR: 148 {beats}/min
NUC STRESS TID: 0.87
Percent HR: 85 %
Rest HR: 50 {beats}/min
SDS: 2
SSS: 3

## 2018-10-06 LAB — ECHOCARDIOGRAM COMPLETE
Height: 73 in
WEIGHTICAEL: 3904 [oz_av]

## 2018-10-06 MED ORDER — TECHNETIUM TC 99M TETROFOSMIN IV KIT
32.5000 | PACK | Freq: Once | INTRAVENOUS | Status: AC | PRN
  Administered 2018-10-06: 32.5 via INTRAVENOUS
  Filled 2018-10-06: qty 33

## 2018-10-06 MED ORDER — TECHNETIUM TC 99M TETROFOSMIN IV KIT
11.0000 | PACK | Freq: Once | INTRAVENOUS | Status: AC | PRN
  Administered 2018-10-06: 11 via INTRAVENOUS
  Filled 2018-10-06: qty 11

## 2018-10-11 ENCOUNTER — Telehealth: Payer: Self-pay

## 2018-10-11 NOTE — Telephone Encounter (Signed)
The patient started to take 20 mg of lisinopril and notice his blood pressure was not getting lower. After a few days, he started taking HCTZ 12.5 mg again, his blood pressure a few hours after taking the medications are 116/62, 123/60 and 125/73. He stated his blood pressure has been normal ever since switching back.   Sending to Dr. Mayford Knife.

## 2018-10-11 NOTE — Telephone Encounter (Signed)
That is fine to continue taking meds as currently taking

## 2018-10-12 NOTE — Telephone Encounter (Signed)
Spoke with the patient, he accepted stopping lisinopril and added back HCTZ to his chart, medication is filled by a doctor in [place].

## 2018-12-05 ENCOUNTER — Emergency Department (HOSPITAL_BASED_OUTPATIENT_CLINIC_OR_DEPARTMENT_OTHER): Payer: Medicare Other

## 2018-12-05 ENCOUNTER — Other Ambulatory Visit: Payer: Self-pay

## 2018-12-05 ENCOUNTER — Emergency Department (HOSPITAL_BASED_OUTPATIENT_CLINIC_OR_DEPARTMENT_OTHER)
Admission: EM | Admit: 2018-12-05 | Discharge: 2018-12-05 | Disposition: A | Discharge status: Home / Self Care | Payer: Medicare Other | Attending: Emergency Medicine | Admitting: Emergency Medicine

## 2018-12-05 ENCOUNTER — Encounter (HOSPITAL_BASED_OUTPATIENT_CLINIC_OR_DEPARTMENT_OTHER): Payer: Self-pay

## 2018-12-05 DIAGNOSIS — Z79899 Other long term (current) drug therapy: Secondary | ICD-10-CM | POA: Diagnosis not present

## 2018-12-05 DIAGNOSIS — Z7984 Long term (current) use of oral hypoglycemic drugs: Secondary | ICD-10-CM | POA: Diagnosis not present

## 2018-12-05 DIAGNOSIS — R1030 Lower abdominal pain, unspecified: Secondary | ICD-10-CM | POA: Diagnosis not present

## 2018-12-05 DIAGNOSIS — I10 Essential (primary) hypertension: Secondary | ICD-10-CM | POA: Diagnosis not present

## 2018-12-05 DIAGNOSIS — E119 Type 2 diabetes mellitus without complications: Secondary | ICD-10-CM | POA: Diagnosis not present

## 2018-12-05 DIAGNOSIS — K649 Unspecified hemorrhoids: Secondary | ICD-10-CM | POA: Diagnosis not present

## 2018-12-05 DIAGNOSIS — K6289 Other specified diseases of anus and rectum: Secondary | ICD-10-CM | POA: Diagnosis present

## 2018-12-05 LAB — CBC WITH DIFFERENTIAL/PLATELET
ABS IMMATURE GRANULOCYTES: 0.02 10*3/uL (ref 0.00–0.07)
Basophils Absolute: 0 10*3/uL (ref 0.0–0.1)
Basophils Relative: 0 %
Eosinophils Absolute: 0 10*3/uL (ref 0.0–0.5)
Eosinophils Relative: 1 %
HCT: 40.7 % (ref 39.0–52.0)
Hemoglobin: 13.1 g/dL (ref 13.0–17.0)
Immature Granulocytes: 0 %
Lymphocytes Relative: 24 %
Lymphs Abs: 1.8 10*3/uL (ref 0.7–4.0)
MCH: 29.3 pg (ref 26.0–34.0)
MCHC: 32.2 g/dL (ref 30.0–36.0)
MCV: 91.1 fL (ref 80.0–100.0)
Monocytes Absolute: 0.7 10*3/uL (ref 0.1–1.0)
Monocytes Relative: 9 %
NEUTROS ABS: 5.2 10*3/uL (ref 1.7–7.7)
Neutrophils Relative %: 66 %
PLATELETS: 231 10*3/uL (ref 150–400)
RBC: 4.47 MIL/uL (ref 4.22–5.81)
RDW: 13.8 % (ref 11.5–15.5)
WBC: 7.8 10*3/uL (ref 4.0–10.5)
nRBC: 0 % (ref 0.0–0.2)

## 2018-12-05 LAB — BASIC METABOLIC PANEL
Anion gap: 7 (ref 5–15)
BUN: 16 mg/dL (ref 8–23)
CO2: 27 mmol/L (ref 22–32)
Calcium: 9.7 mg/dL (ref 8.9–10.3)
Chloride: 103 mmol/L (ref 98–111)
Creatinine, Ser: 0.76 mg/dL (ref 0.61–1.24)
GFR calc non Af Amer: >60 mL/min (ref >60)
Glucose, Bld: 108 mg/dL — ABNORMAL HIGH (ref 70–99)
Potassium: 3.6 mmol/L (ref 3.5–5.1)
Sodium: 137 mmol/L (ref 135–145)

## 2018-12-05 LAB — OCCULT BLOOD X 1 CARD TO LAB, STOOL: Fecal Occult Bld: NEGATIVE

## 2018-12-05 MED ORDER — DOCUSATE SODIUM 250 MG PO CAPS: 250.0000 mg | ORAL_CAPSULE | Freq: Every day | ORAL | 0 refills | Status: DC

## 2018-12-05 MED ORDER — IOPAMIDOL (ISOVUE-300) INJECTION 61%
100.0000 mL | Freq: Once | INTRAVENOUS | Status: AC | PRN
  Administered 2018-12-05: 100 mL via INTRAVENOUS

## 2018-12-05 MED ORDER — LIDOCAINE (ANORECTAL) 5 % EX GEL: 1.0000 "application " | Freq: Two times a day (BID) | CUTANEOUS | 0 refills | Status: DC

## 2018-12-05 MED ORDER — LIDOCAINE (ANORECTAL) 5 % EX CREA: 1.0000 "application " | TOPICAL_CREAM | Freq: Two times a day (BID) | CUTANEOUS | 0 refills | Status: AC

## 2018-12-05 MED ORDER — STARCH 51 % RE SUPP: 1.0000 | RECTAL | 0 refills | Status: DC | PRN

## 2018-12-05 MED ORDER — HYDROCORTISONE 2.5 % RE CREA: TOPICAL_CREAM | RECTAL | 0 refills | Status: DC

## 2018-12-05 NOTE — ED Notes (Signed)
Pt verbalizes understanding of d/c instructions and denies any further needs at this time. 

## 2018-12-05 NOTE — ED Notes (Signed)
Pt in CT.

## 2018-12-05 NOTE — Discharge Instructions (Addendum)
Results in the ER are negative for any acute process.  You definitely have lesions that are concerning for possible hemorrhoids.  Take the medications prescribed and follow-up with your doctor in 1 week.

## 2018-12-05 NOTE — ED Provider Notes (Signed)
[facility] EMERGENCY DEPARTMENT Provider Note   CSN: [id number] Arrival date & time: 12/05/18  1207     History   Chief Complaint Chief Complaint  Patient presents with  . Abdominal Pain    HPI Arthur Spencer is a 72 y.o. male.  HPI  72 year old male comes in a chief complaint of rectal pain.  Patient reports that over the past few days he has had lower quadrant abdominal pain along with rectal pain.  He is also noticed some bulging when he has a bowel movement.  Upon wiping after BM, he has noted some orange remnants on the tissue.  Patient denies any associated nausea, vomiting.  His bowel movements have been inconsistent, often he is constipated followed by loose bowel movements. His last colonoscopy was in 2015 which was negative.  Past Medical History:  Diagnosis Date  . Diabetes mellitus without complication (HCC)   . Epididymitis 02/13/2018  . High cholesterol   . Hypertension     Patient Active Problem List   Diagnosis Date Noted  . Syncope 09/26/2018  . Benign essential HTN 09/26/2018  . DM (diabetes mellitus), type 2 (HCC) 09/26/2018    Past Surgical History:  Procedure Laterality Date  . INGUINAL HERNIA REPAIR     As infant  . LUMBAR SPINE SURGERY  2009   discectomy  . LUMBAR SPINE SURGERY  2015   enlarged nerve canal   . TONSILLECTOMY          Home Medications    Prior to Admission medications   Medication Sig Start Date End Date Taking? Authorizing Provider  docusate sodium (COLACE) 250 MG capsule Take 1 capsule (250 mg total) by mouth daily. 12/05/18   Derwood KaplanNanavati, Finneas Mathe, MD  ezetimibe (ZETIA) 10 MG tablet Take 10 mg by mouth daily.    [provider]  hydrochlorothiazide (MICROZIDE) 12.5 MG capsule Take 12.5 mg by mouth daily.    [provider]  Lidocaine, Anorectal, 5 % CREA Apply 1 application topically 2 (two) times daily for 5 days. 12/05/18 12/10/18  Derwood KaplanNanavati, Lidie Glade, MD  metFORMIN (GLUCOPHAGE) 500 MG tablet  Take by mouth 2 (two) times daily with a meal.    [provider]  starch (ANUSOL) 51 % suppository Place 1 suppository rectally as needed for pain (no more than 5 days in a row). 12/05/18   Derwood KaplanNanavati, Dhara Schepp, MD    Family History No family history on file.  Social History Social History   Tobacco Use  . Smoking status: Never Smoker  . Smokeless tobacco: Never Used  Substance Use Topics  . Alcohol use: Not Currently    Frequency: Never  . Drug use: Never     Allergies   Patient has no known allergies.   Review of Systems Review of Systems  Constitutional: Positive for activity change.  Gastrointestinal: Positive for abdominal pain and anal bleeding.  All other systems reviewed and are negative.    Physical Exam Updated Vital Signs BP 117/65 (BP Location: Right Arm)   Pulse 60   Temp 98.7 F (37.1 C) (Oral)   Resp 18   Ht 6\' 1"  (1.854 m)   Wt 106.6 kg   SpO2 98%   BMI 31.00 kg/m   Physical Exam Vitals signs and nursing note reviewed.  Constitutional:      Appearance: He is well-developed.  HENT:     Head: Atraumatic.  Neck:     Musculoskeletal: Neck supple.  Cardiovascular:     Rate and Rhythm: Normal  rate.  Pulmonary:     Effort: Pulmonary effort is normal.  Abdominal:     Comments: Pt has tenderness over the lower abdominal quadrants There is no guarding or rebound. Digital rectal exam reveals a lesion over the perirectal region that is erythematous, but not severely tender.  Digital rectal exam revealed no gross blood.   Skin:    General: Skin is warm.  Neurological:     Mental Status: He is alert and oriented to person, place, and time.      ED Treatments / Results  Labs (all labs ordered are listed, but only abnormal results are displayed) Labs Reviewed  BASIC METABOLIC PANEL - Abnormal; Notable for the following components:      Result Value   Glucose, Bld 108 (*)    All other components within normal limits  OCCULT BLOOD X 1  CARD TO LAB, STOOL  CBC WITH DIFFERENTIAL/PLATELET  POC OCCULT BLOOD, ED    EKG None  Radiology Ct Abdomen Pelvis W Contrast  Result Date: 12/05/2018 CLINICAL DATA:  Rectal and lower quadrant abdominal pain, loose stools for 5 days, question gastroenteritis or colitis, question diverticulitis or rectal abscess; history diabetes mellitus, hypertension, inguinal hernia repair EXAM: CT ABDOMEN AND PELVIS WITH CONTRAST TECHNIQUE: Multidetector CT imaging of the abdomen and pelvis was performed using the standard protocol following bolus administration of intravenous contrast. Sagittal and coronal MPR images reconstructed from axial data set. CONTRAST:  100mL ISOVUE-300 IOPAMIDOL (ISOVUE-300) INJECTION 61% IV. No oral contrast COMPARISON:  None FINDINGS: Lower chest: Lung bases clear Hepatobiliary: Gallbladder and liver normal appearance Pancreas: Normal appearance Spleen: Normal appearance Adrenals/Urinary Tract: Calcification identified at RIGHT adrenal gland question prior hemorrhage or infection. Remainder of adrenal glands normal appearance. Small cortical scar at inferior aspect of LEFT kidney laterally. Kidneys, ureters, and bladder otherwise normal appearance. Stomach/Bowel: Appendix not visualized. Scattered colonic diverticulosis without evidence of diverticulitis. Stomach and bowel loops otherwise normal appearance. Vascular/Lymphatic: Atherosclerotic calcifications aorta and iliac arteries. No adenopathy Reproductive: Prostate gland and seminal vesicles unremarkable. Other: Tiny BILATERAL inguinal hernias containing fat. No free air, free fluid or acute inflammatory process. Musculoskeletal: Multilevel facet degenerative changes lower lumbar spine. Degenerative disc disease changes L5-S1. IMPRESSION: No acute intra-abdominal or intrapelvic abnormalities. Colonic diverticulosis without definite evidence of acute diverticulitis. RIGHT adrenal calcification question sequela of prior hemorrhage or  infection. Tiny BILATERAL inguinal hernias containing fat. Aortic Atherosclerosis (ICD10-I70.0). Electronically Signed   By: Ulyses SouthwardMark  Boles M.D.   On: 12/05/2018 15:48    Procedures Procedures (including critical care time)  Medications Ordered in ED Medications  iopamidol (ISOVUE-300) 61 % injection 100 mL (100 mLs Intravenous Contrast Given 12/05/18 1500)     Initial Impression / Assessment and Plan / ED Course  I have reviewed the triage vital signs and the nursing notes.  Pertinent labs & imaging results that were available during my care of the patient were reviewed by me and considered in my medical decision making (see chart for details).    72 year old comes in a chief complaint of lower quadrant abdominal pain, inconsistent BM, bloody stools, or rectal pain. Exam he does appear to have a rectal lesion.  Does not seem consistent with hemorrhoid, however it is tender and likely the source for his discomfort in the perianal region. I discussed with the patient that suspicion for diverticulitis or intra-abdominal infection is low.  I informed him that we would like him to be managed conservatively with a PCP follow-up, however patient prefers that he  gets as he does not have a PCP appointment until middle of January.  CT ordered to see if there is diverticulitis or rectal abscess. Results do not show evidence of abscess or diverticulitis. Patient is aware of the diagnosis and will discharge him with topical medications and follow-up with PCP.  If the mass continues to be present, they might have to biopsy or send patient to a GI.  Final Clinical Impressions(s) / ED Diagnoses   Final diagnoses:  Hemorrhoids, unspecified hemorrhoid type  Rectal pain    ED Discharge Orders         Ordered    hydrocortisone (ANUSOL-HC) 2.5 % rectal cream  Status:  Discontinued     12/05/18 1632    Lidocaine, Anorectal, 5 % GEL  2 times daily,   Status:  Discontinued     12/05/18 1632    docusate  sodium (COLACE) 250 MG capsule  Daily,   Status:  Discontinued     12/05/18 1632    Lidocaine, Anorectal, 5 % CREA  2 times daily     12/05/18 1646    docusate sodium (COLACE) 250 MG capsule  Daily     12/05/18 1646    starch (ANUSOL) 51 % suppository  As needed     12/05/18 1646           Derwood Kaplan, MD 12/05/18 1657

## 2018-12-05 NOTE — ED Triage Notes (Signed)
C/o lower abd pain, loose stools x 5 days-NAD-steady gait

## 2019-09-09 ENCOUNTER — Encounter (HOSPITAL_BASED_OUTPATIENT_CLINIC_OR_DEPARTMENT_OTHER): Payer: Self-pay | Admitting: Emergency Medicine

## 2019-09-09 ENCOUNTER — Observation Stay (HOSPITAL_BASED_OUTPATIENT_CLINIC_OR_DEPARTMENT_OTHER)
Admission: EM | Admit: 2019-09-09 | Discharge: 2019-09-10 | Disposition: A | Discharge status: Home / Self Care | Payer: Medicare Other | Source: Home / Self Care | Attending: Emergency Medicine | Admitting: Emergency Medicine

## 2019-09-09 ENCOUNTER — Other Ambulatory Visit: Payer: Self-pay

## 2019-09-09 ENCOUNTER — Emergency Department (HOSPITAL_BASED_OUTPATIENT_CLINIC_OR_DEPARTMENT_OTHER): Payer: Medicare Other

## 2019-09-09 DIAGNOSIS — R7881 Bacteremia: Secondary | ICD-10-CM | POA: Diagnosis not present

## 2019-09-09 DIAGNOSIS — R509 Fever, unspecified: Secondary | ICD-10-CM

## 2019-09-09 LAB — CBC WITH DIFFERENTIAL/PLATELET
Abs Immature Granulocytes: 0.03 10*3/uL (ref 0.00–0.07)
Basophils Absolute: 0 10*3/uL (ref 0.0–0.1)
Basophils Relative: 0 %
Eosinophils Absolute: 0 10*3/uL (ref 0.0–0.5)
Eosinophils Relative: 0 %
HCT: 37.8 % — ABNORMAL LOW (ref 39.0–52.0)
Hemoglobin: 12.2 g/dL — ABNORMAL LOW (ref 13.0–17.0)
Immature Granulocytes: 1 %
Lymphocytes Relative: 5 %
Lymphs Abs: 0.2 10*3/uL — ABNORMAL LOW (ref 0.7–4.0)
MCH: 29.5 pg (ref 26.0–34.0)
MCHC: 32.3 g/dL (ref 30.0–36.0)
MCV: 91.5 fL (ref 80.0–100.0)
Monocytes Absolute: 0.1 10*3/uL (ref 0.1–1.0)
Monocytes Relative: 3 %
Neutro Abs: 3 10*3/uL (ref 1.7–7.7)
Neutrophils Relative %: 91 %
Platelets: 102 10*3/uL — ABNORMAL LOW (ref 150–400)
RBC: 4.13 MIL/uL — ABNORMAL LOW (ref 4.22–5.81)
RDW: 14.2 % (ref 11.5–15.5)
WBC: 3.4 10*3/uL — ABNORMAL LOW (ref 4.0–10.5)
nRBC: 0 % (ref 0.0–0.2)

## 2019-09-09 LAB — COMPREHENSIVE METABOLIC PANEL
ALT: 105 U/L — ABNORMAL HIGH (ref 0–44)
AST: 74 U/L — ABNORMAL HIGH (ref 15–41)
Albumin: 3.8 g/dL (ref 3.5–5.0)
Alkaline Phosphatase: 90 U/L (ref 38–126)
Anion gap: 13 (ref 5–15)
BUN: 17 mg/dL (ref 8–23)
CO2: 23 mmol/L (ref 22–32)
Calcium: 9 mg/dL (ref 8.9–10.3)
Chloride: 100 mmol/L (ref 98–111)
Creatinine, Ser: 0.99 mg/dL (ref 0.61–1.24)
GFR calc Af Amer: >60 mL/min (ref >60)
GFR calc non Af Amer: >60 mL/min (ref >60)
Glucose, Bld: 199 mg/dL — ABNORMAL HIGH (ref 70–99)
Potassium: 3.9 mmol/L (ref 3.5–5.1)
Sodium: 136 mmol/L (ref 135–145)
Total Bilirubin: 0.8 mg/dL (ref 0.3–1.2)
Total Protein: 6.6 g/dL (ref 6.5–8.1)

## 2019-09-09 LAB — LACTIC ACID, PLASMA
Lactic Acid, Venous: 2.5 mmol/L (ref 0.5–1.9)
Lactic Acid, Venous: 1.3 mmol/L (ref 0.5–1.9)

## 2019-09-09 LAB — URINALYSIS, ROUTINE W REFLEX MICROSCOPIC
Bilirubin Urine: NEGATIVE
Glucose, UA: NEGATIVE mg/dL
Hgb urine dipstick: NEGATIVE
Ketones, ur: 15 mg/dL — AB
Leukocytes,Ua: NEGATIVE
Nitrite: NEGATIVE
Protein, ur: NEGATIVE mg/dL
Specific Gravity, Urine: 1.015 (ref 1.005–1.030)
pH: 6 (ref 5.0–8.0)

## 2019-09-09 LAB — SARS CORONAVIRUS 2 BY RT PCR (HOSPITAL ORDER, PERFORMED IN ~~LOC~~ HOSPITAL LAB): SARS Coronavirus 2: NEGATIVE

## 2019-09-09 MED ORDER — VANCOMYCIN HCL IN DEXTROSE 1-5 GM/200ML-% IV SOLN
1000.0000 mg | Freq: Once | INTRAVENOUS | Status: AC
  Administered 2019-09-09: 15:00:00 1000 mg via INTRAVENOUS
  Filled 2019-09-09: qty 200

## 2019-09-09 MED ORDER — SODIUM CHLORIDE 0.9 % IV BOLUS
1000.0000 mL | Freq: Once | INTRAVENOUS | Status: AC
  Administered 2019-09-09: 1000 mL via INTRAVENOUS

## 2019-09-09 MED ORDER — ACETAMINOPHEN 325 MG PO TABS
650.0000 mg | ORAL_TABLET | Freq: Once | ORAL | Status: AC | PRN
  Administered 2019-09-09: 650 mg via ORAL
  Filled 2019-09-09: qty 2

## 2019-09-09 MED ORDER — PIPERACILLIN-TAZOBACTAM 3.375 G IVPB
3.3750 g | Freq: Once | INTRAVENOUS | Status: AC
  Administered 2019-09-09: 3.375 g via INTRAVENOUS
  Filled 2019-09-09: qty 50

## 2019-09-09 NOTE — ED Provider Notes (Addendum)
[facility] EMERGENCY DEPARTMENT Provider Note   CSN: [id number] Arrival date & time: 09/09/19  1021     History   Chief Complaint Chief Complaint  Patient presents with  . Fever    HPI Arthur Spencer is a 73 y.o. male.     Patient is a 73 year old male with past medical history of diabetes, hypertension, and hyperlipidemia.  He presents today for evaluation of weakness and not feeling well for the past 3 days.  Patient's wife describes episodes of shaking lasting for minutes at a time.  He had an episode this morning where he began to shake.  He did not lose consciousness and was alert throughout these episodes.  He has taken his temperature at home, but has not been febrile.  Upon presentation today his temp is 103, but he denies symptoms that would explain this fever.  He denies any cough, abdominal pain, diarrhea, urinary complaints.  The history is provided by the patient.  Fever Temp source:  Oral Severity:  Moderate Onset quality:  Gradual Duration:  3 days Timing:  Constant Progression:  Worsening Chronicity:  New Relieved by:  Nothing Worsened by:  Nothing Ineffective treatments:  None tried Associated symptoms: chills and myalgias   Associated symptoms: no confusion, no congestion and no cough     Past Medical History:  Diagnosis Date  . Diabetes mellitus without complication (HCC)   . Epididymitis 02/13/2018  . High cholesterol   . Hypertension     Patient Active Problem List   Diagnosis Date Noted  . Syncope 09/26/2018  . Benign essential HTN 09/26/2018  . DM (diabetes mellitus), type 2 (HCC) 09/26/2018    Past Surgical History:  Procedure Laterality Date  . INGUINAL HERNIA REPAIR     As infant  . LUMBAR SPINE SURGERY  2009   discectomy  . LUMBAR SPINE SURGERY  2015   enlarged nerve canal   . TONSILLECTOMY          Home Medications    Prior to Admission medications   Medication Sig Start Date End Date Taking? Authorizing Provider   lisinopril (ZESTRIL) 5 MG tablet  02/19/19  Yes [provider]  atorvastatin (LIPITOR) 40 MG tablet Take 40 mg by mouth at bedtime. 06/24/19   [provider]  docusate sodium (COLACE) 250 MG capsule Take 1 capsule (250 mg total) by mouth daily. 12/05/18   Derwood Kaplan, MD  ezetimibe (ZETIA) 10 MG tablet Take 10 mg by mouth daily.    [provider]  hydrochlorothiazide (MICROZIDE) 12.5 MG capsule Take 12.5 mg by mouth daily.    [provider]  metFORMIN (GLUCOPHAGE) 500 MG tablet Take by mouth 2 (two) times daily with a meal.    [provider]  starch (ANUSOL) 51 % suppository Place 1 suppository rectally as needed for pain (no more than 5 days in a row). 12/05/18   Derwood Kaplan, MD    Family History History reviewed. No pertinent family history.  Social History Social History   Tobacco Use  . Smoking status: Never Smoker  . Smokeless tobacco: Never Used  Substance Use Topics  . Alcohol use: Not Currently    Frequency: Never  . Drug use: Never     Allergies   Patient has no known allergies.   Review of Systems Review of Systems  Constitutional: Positive for chills and fever.  HENT: Negative for congestion.   Respiratory: Negative for cough.   Musculoskeletal: Positive for myalgias.  Psychiatric/Behavioral: Negative  for confusion.  All other systems reviewed and are negative.    Physical Exam Updated Vital Signs BP 138/64 (BP Location: Right Arm)   Pulse 86   Temp (!) 103.2 F (39.6 C) (Oral)   Resp 13   Ht 6\' 1"  (1.854 m)   Wt 106.6 kg   SpO2 93%   BMI 31.00 kg/m   Physical Exam Vitals signs and nursing note reviewed.  Constitutional:      General: He is not in acute distress.    Appearance: He is well-developed. He is not diaphoretic.  HENT:     Head: Normocephalic and atraumatic.  Neck:     Musculoskeletal: Normal range of motion and neck supple.  Cardiovascular:     Rate and Rhythm: Normal rate and  regular rhythm.     Heart sounds: No murmur. No friction rub.  Pulmonary:     Effort: Pulmonary effort is normal. No respiratory distress.     Breath sounds: Normal breath sounds. No wheezing or rales.  Abdominal:     General: Bowel sounds are normal. There is no distension.     Palpations: Abdomen is soft.     Tenderness: There is no abdominal tenderness.  Musculoskeletal: Normal range of motion.        General: No swelling, tenderness, deformity or signs of injury.  Skin:    General: Skin is warm and dry.  Neurological:     Mental Status: He is alert and oriented to person, place, and time.     Coordination: Coordination normal.      ED Treatments / Results  Labs (all labs ordered are listed, but only abnormal results are displayed) Labs Reviewed  CULTURE, BLOOD (ROUTINE X 2)  CULTURE, BLOOD (ROUTINE X 2)  URINE CULTURE  SARS CORONAVIRUS 2 (HOSPITAL ORDER, [facility])  LACTIC ACID, PLASMA  LACTIC ACID, PLASMA  COMPREHENSIVE METABOLIC PANEL  CBC WITH DIFFERENTIAL/PLATELET  URINALYSIS, ROUTINE W REFLEX MICROSCOPIC    EKG None  Radiology No results found.  Procedures Procedures (including critical care time)  Medications Ordered in ED Medications  acetaminophen (TYLENOL) tablet 650 mg (650 mg Oral Given 09/09/19 1054)     Initial Impression / Assessment and Plan / ED Course  I have reviewed the triage vital signs and the nursing notes.  Pertinent labs & imaging results that were available during my care of the patient were reviewed by me and considered in my medical decision making (see chart for details).  Patient presenting here with complaints of weakness for the past several days.  He has had episodes of chills and rigors, but no other symptoms to the explain the 104 fever he presented with.  Due to the degree of fever and shaking chills, septic work-up has been initiated and patient given broad-spectrum antibiotics.  He will be  admitted to the hospitalist service for additional observation and awaiting of blood culture results.  I have spoken with Dr. Lorin Mercy who agrees to admit.  Addendum 09/10/19, 08:34AM.  Patient seen by me yesterday morning and arrangements were made for him to be admitted for evaluation of an acute febrile illness with chills and rigors.  There were no inpatient beds available at [facility], therefore the patient spent the night here at [facility].  Patient is feeling much better this morning.  He is remained basically afebrile overnight.  He is ambulatory in the department without difficulty.  Patient's care discussed with Dr. Lorin Mercy at [facility].  She feels as though patient is appropriate for discharge without transfer.  Patient will be treated with antibiotics for presumed pneumonia.  I will prescribe Augmentin.  Patient to follow up with pcp and will be notified if his cultures become positive.    Final Clinical Impressions(s) / ED Diagnoses   Final diagnoses:  None    ED Discharge Orders    None       Geoffery Lyonselo, Trayce Maino, MD 09/10/19 [id number]    Geoffery Lyonselo, Johnsie Moscoso, MD 09/10/19 [id number]

## 2019-09-09 NOTE — ED Notes (Signed)
Lactic Acid 2.5, results given to ED MD

## 2019-09-09 NOTE — ED Notes (Signed)
Recliner provided for spouse to spend the night with patient.  No distress noted or complaints voiced at this time.  Encouraged to call for assistance as needed.

## 2019-09-09 NOTE — ED Notes (Signed)
Urinal at beside

## 2019-09-09 NOTE — Progress Notes (Addendum)
Patient with h/o HTN, HLD, obesity (BMI 31), and DM presenting to [facility] with fever.  Generally healthy 73yo presenting with fatigue, weakness, rigors.  Afebrile at home but 104.3 in the ER.  CXR ok.  Labs ok.  Lactate 2.5.  No apparent source, but given sepsis.  No AMS, likely does not need LP.  Will give broad spectrum antibiotics empirically for now.  Needs to be PUI on 2W, obs tele.  Carlyon Shadow, M.D.

## 2019-09-09 NOTE — ED Triage Notes (Signed)
Patient states that he has felt bad x 3 days - he denies any fever - patient is warm to touch. Patient states that he has generalized weakness

## 2019-09-10 ENCOUNTER — Telehealth (HOSPITAL_BASED_OUTPATIENT_CLINIC_OR_DEPARTMENT_OTHER): Payer: Self-pay | Admitting: Emergency Medicine

## 2019-09-10 ENCOUNTER — Inpatient Hospital Stay (HOSPITAL_BASED_OUTPATIENT_CLINIC_OR_DEPARTMENT_OTHER)
Admission: AD | Admit: 2019-09-10 | Discharge: 2019-09-14 | DRG: 872 | Disposition: A | Discharge status: Home / Self Care | Payer: Medicare Other | Source: Ambulatory Visit | Attending: Internal Medicine | Admitting: Internal Medicine

## 2019-09-10 ENCOUNTER — Encounter (HOSPITAL_COMMUNITY): Payer: Self-pay | Admitting: Family Medicine

## 2019-09-10 DIAGNOSIS — D696 Thrombocytopenia, unspecified: Secondary | ICD-10-CM | POA: Diagnosis present

## 2019-09-10 DIAGNOSIS — E119 Type 2 diabetes mellitus without complications: Secondary | ICD-10-CM

## 2019-09-10 DIAGNOSIS — R599 Enlarged lymph nodes, unspecified: Secondary | ICD-10-CM | POA: Diagnosis present

## 2019-09-10 DIAGNOSIS — B961 Klebsiella pneumoniae [K. pneumoniae] as the cause of diseases classified elsewhere: Secondary | ICD-10-CM | POA: Diagnosis present

## 2019-09-10 DIAGNOSIS — E785 Hyperlipidemia, unspecified: Secondary | ICD-10-CM | POA: Diagnosis present

## 2019-09-10 DIAGNOSIS — K5900 Constipation, unspecified: Secondary | ICD-10-CM | POA: Diagnosis present

## 2019-09-10 DIAGNOSIS — E1100 Type 2 diabetes mellitus with hyperosmolarity without nonketotic hyperglycemic-hyperosmolar coma (NKHHC): Secondary | ICD-10-CM

## 2019-09-10 DIAGNOSIS — Z20828 Contact with and (suspected) exposure to other viral communicable diseases: Secondary | ICD-10-CM | POA: Diagnosis present

## 2019-09-10 DIAGNOSIS — R7881 Bacteremia: Principal | ICD-10-CM | POA: Diagnosis present

## 2019-09-10 DIAGNOSIS — Z7984 Long term (current) use of oral hypoglycemic drugs: Secondary | ICD-10-CM

## 2019-09-10 DIAGNOSIS — Z79899 Other long term (current) drug therapy: Secondary | ICD-10-CM

## 2019-09-10 DIAGNOSIS — I10 Essential (primary) hypertension: Secondary | ICD-10-CM | POA: Diagnosis present

## 2019-09-10 DIAGNOSIS — K5732 Diverticulitis of large intestine without perforation or abscess without bleeding: Secondary | ICD-10-CM | POA: Diagnosis present

## 2019-09-10 DIAGNOSIS — Z6831 Body mass index (BMI) 31.0-31.9, adult: Secondary | ICD-10-CM | POA: Diagnosis not present

## 2019-09-10 DIAGNOSIS — D72810 Lymphocytopenia: Secondary | ICD-10-CM | POA: Diagnosis present

## 2019-09-10 DIAGNOSIS — E669 Obesity, unspecified: Secondary | ICD-10-CM | POA: Diagnosis present

## 2019-09-10 LAB — COMPREHENSIVE METABOLIC PANEL
ALT: 129 U/L — ABNORMAL HIGH (ref 0–44)
AST: 95 U/L — ABNORMAL HIGH (ref 15–41)
Albumin: 3.4 g/dL — ABNORMAL LOW (ref 3.5–5.0)
Alkaline Phosphatase: 86 U/L (ref 38–126)
Anion gap: 12 (ref 5–15)
BUN: 9 mg/dL (ref 8–23)
CO2: 22 mmol/L (ref 22–32)
Calcium: 9.1 mg/dL (ref 8.9–10.3)
Chloride: 101 mmol/L (ref 98–111)
Creatinine, Ser: 0.97 mg/dL (ref 0.61–1.24)
GFR calc Af Amer: >60 mL/min (ref >60)
GFR calc non Af Amer: >60 mL/min (ref >60)
Glucose, Bld: 126 mg/dL — ABNORMAL HIGH (ref 70–99)
Potassium: 3.6 mmol/L (ref 3.5–5.1)
Sodium: 135 mmol/L (ref 135–145)
Total Bilirubin: 0.7 mg/dL (ref 0.3–1.2)
Total Protein: 6.6 g/dL (ref 6.5–8.1)

## 2019-09-10 LAB — CBC WITH DIFFERENTIAL/PLATELET
Abs Immature Granulocytes: 0.02 10*3/uL (ref 0.00–0.07)
Basophils Absolute: 0 10*3/uL (ref 0.0–0.1)
Basophils Relative: 0 %
Eosinophils Absolute: 0.1 10*3/uL (ref 0.0–0.5)
Eosinophils Relative: 1 %
HCT: 36.1 % — ABNORMAL LOW (ref 39.0–52.0)
Hemoglobin: 12.3 g/dL — ABNORMAL LOW (ref 13.0–17.0)
Immature Granulocytes: 0 %
Lymphocytes Relative: 15 %
Lymphs Abs: 1 10*3/uL (ref 0.7–4.0)
MCH: 30.5 pg (ref 26.0–34.0)
MCHC: 34.1 g/dL (ref 30.0–36.0)
MCV: 89.6 fL (ref 80.0–100.0)
Monocytes Absolute: 0.8 10*3/uL (ref 0.1–1.0)
Monocytes Relative: 11 %
Neutro Abs: 4.8 10*3/uL (ref 1.7–7.7)
Neutrophils Relative %: 73 %
Platelets: 117 10*3/uL — ABNORMAL LOW (ref 150–400)
RBC: 4.03 MIL/uL — ABNORMAL LOW (ref 4.22–5.81)
RDW: 14.6 % (ref 11.5–15.5)
WBC: 6.7 10*3/uL (ref 4.0–10.5)
nRBC: 0 % (ref 0.0–0.2)

## 2019-09-10 LAB — BLOOD CULTURE ID PANEL (REFLEXED)

## 2019-09-10 LAB — HEMOGLOBIN A1C
Hgb A1c MFr Bld: 6.8 % — ABNORMAL HIGH (ref 4.8–5.6)
Mean Plasma Glucose: 148.46 mg/dL

## 2019-09-10 LAB — TYPE AND SCREEN
ABO/RH(D): A POS
Antibody Screen: NEGATIVE

## 2019-09-10 LAB — URINE CULTURE: Culture: <10000 — AB

## 2019-09-10 LAB — GLUCOSE, CAPILLARY
Glucose-Capillary: 125 mg/dL — ABNORMAL HIGH (ref 70–99)
Glucose-Capillary: 129 mg/dL — ABNORMAL HIGH (ref 70–99)

## 2019-09-10 LAB — SARS CORONAVIRUS 2 BY RT PCR (HOSPITAL ORDER, PERFORMED IN ~~LOC~~ HOSPITAL LAB): SARS Coronavirus 2: NEGATIVE

## 2019-09-10 MED ORDER — SODIUM CHLORIDE 0.9 % IV SOLN
1.0000 g | Freq: Three times a day (TID) | INTRAVENOUS | Status: DC
  Administered 2019-09-10 – 2019-09-12 (×5): 1 g via INTRAVENOUS
  Filled 2019-09-10 (×7): qty 1

## 2019-09-10 MED ORDER — ONDANSETRON HCL 4 MG PO TABS: 4.0000 mg | ORAL_TABLET | Freq: Four times a day (QID) | ORAL | Status: DC | PRN

## 2019-09-10 MED ORDER — HYDROCODONE-ACETAMINOPHEN 5-325 MG PO TABS: 1.0000 | ORAL_TABLET | ORAL | Status: DC | PRN

## 2019-09-10 MED ORDER — SODIUM CHLORIDE 0.9 % IV SOLN
250.0000 mL | INTRAVENOUS | Status: DC | PRN
  Administered 2019-09-13: 250 mL via INTRAVENOUS

## 2019-09-10 MED ORDER — INSULIN ASPART 100 UNIT/ML ~~LOC~~ SOLN
0.0000 [IU] | Freq: Every day | SUBCUTANEOUS | Status: DC
  Administered 2019-09-12: 2 [IU] via SUBCUTANEOUS

## 2019-09-10 MED ORDER — ONDANSETRON HCL 4 MG/2ML IJ SOLN: 4.0000 mg | Freq: Four times a day (QID) | INTRAMUSCULAR | Status: DC | PRN

## 2019-09-10 MED ORDER — INSULIN ASPART 100 UNIT/ML ~~LOC~~ SOLN
0.0000 [IU] | Freq: Three times a day (TID) | SUBCUTANEOUS | Status: DC
  Administered 2019-09-11: 1 [IU] via SUBCUTANEOUS
  Administered 2019-09-11 – 2019-09-12 (×2): 2 [IU] via SUBCUTANEOUS
  Administered 2019-09-12: 08:00:00 1 [IU] via SUBCUTANEOUS
  Administered 2019-09-12 – 2019-09-13 (×3): 2 [IU] via SUBCUTANEOUS
  Administered 2019-09-13: 1 [IU] via SUBCUTANEOUS
  Administered 2019-09-14 (×2): 2 [IU] via SUBCUTANEOUS

## 2019-09-10 MED ORDER — ACETAMINOPHEN 325 MG PO TABS
650.0000 mg | ORAL_TABLET | Freq: Four times a day (QID) | ORAL | Status: DC | PRN
  Administered 2019-09-12: 650 mg via ORAL
  Filled 2019-09-10: qty 2

## 2019-09-10 MED ORDER — ACETAMINOPHEN 650 MG RE SUPP: 650.0000 mg | Freq: Four times a day (QID) | RECTAL | Status: DC | PRN

## 2019-09-10 MED ORDER — BISACODYL 5 MG PO TBEC
5.0000 mg | DELAYED_RELEASE_TABLET | Freq: Every day | ORAL | Status: DC | PRN
  Administered 2019-09-13: 11:00:00 5 mg via ORAL
  Filled 2019-09-10: qty 1

## 2019-09-10 MED ORDER — AMOXICILLIN-POT CLAVULANATE 500-125 MG PO TABS: 1.0000 | ORAL_TABLET | Freq: Three times a day (TID) | ORAL | 0 refills | Status: DC

## 2019-09-10 MED ORDER — LISINOPRIL 5 MG PO TABS
5.0000 mg | ORAL_TABLET | Freq: Every day | ORAL | Status: DC
  Administered 2019-09-11: 11:00:00 5 mg via ORAL
  Filled 2019-09-10: qty 1

## 2019-09-10 MED ORDER — CALCIUM CARBONATE ANTACID 500 MG PO CHEW
200.0000 mg | CHEWABLE_TABLET | Freq: Once | ORAL | Status: AC
  Administered 2019-09-10: 200 mg via ORAL
  Filled 2019-09-10: qty 1

## 2019-09-10 MED ORDER — SENNOSIDES-DOCUSATE SODIUM 8.6-50 MG PO TABS
1.0000 | ORAL_TABLET | Freq: Every evening | ORAL | Status: DC | PRN
  Administered 2019-09-13: 1 via ORAL
  Filled 2019-09-10: qty 1

## 2019-09-10 MED ORDER — ATORVASTATIN CALCIUM 40 MG PO TABS
40.0000 mg | ORAL_TABLET | Freq: Every day | ORAL | Status: DC
  Administered 2019-09-10 – 2019-09-13 (×4): 40 mg via ORAL
  Filled 2019-09-10 (×4): qty 1

## 2019-09-10 MED ORDER — SODIUM CHLORIDE 0.9% FLUSH: 3.0000 mL | INTRAVENOUS | Status: DC | PRN

## 2019-09-10 NOTE — H&P (Signed)
History and Physical    Arthur SacksRhoi Spencer ZOX:[id number] DOB: 06/24/1946 DOA: 09/10/2019  PCP: Caffie DammeSmith, Karla, MD   Patient coming from: Home   Chief Complaint: Malaise, bacteremia   HPI: Arthur Spencer is a 73 y.o. male with medical history significant for type 2 diabetes mellitus, hypertension, and hyperlipidemia, now returning to the hospital for admission for positive blood culture from yesterday.  Patient had been in his usual state of health until 09/06/2019 when he developed acute onset of generalized abdominal pain, nausea, and malaise.  The abdominal pain resolved, but he continued to feel generally weak.  He denies vomiting, diarrhea, or dysuria.  He also denies flank pain.  He has a chronic cough that he attributes to sinus problems, but denies any change in that or new shortness of breath.  Patient had an office-based procedure 2 months ago, bilateral resection of inferior turbinates, and has not had any issues from that.  He was seen in the ED yesterday, cultures were drawn, he received broad-spectrum antibiotics, felt better, and was discharged with Augmentin for possible CAP.  Blood cultures grew Klebsiella, sensitivities pending, and the patient was called back and instructed to present to the hospital for direct admission.   On arrival, patient is afebrile with normal heart rate and normal blood pressure, saturating well on room air, appears fatigued, but in no distress.   Review of Systems:  All other systems reviewed and apart from HPI, are negative.  Past Medical History:  Diagnosis Date  . Diabetes mellitus without complication (HCC)   . Epididymitis 02/13/2018  . High cholesterol   . Hypertension     Past Surgical History:  Procedure Laterality Date  . INGUINAL HERNIA REPAIR     As infant  . LUMBAR SPINE SURGERY  2009   discectomy  . LUMBAR SPINE SURGERY  2015   enlarged nerve canal   . TONSILLECTOMY       reports that he has never smoked. He has never used smokeless  tobacco. He reports previous alcohol use. He reports that he does not use drugs.  No Known Allergies  History reviewed. No pertinent family history.   Prior to Admission medications   Medication Sig Start Date End Date Taking? Authorizing Provider  amoxicillin-clavulanate (AUGMENTIN) 500-125 MG tablet Take 1 tablet (500 mg total) by mouth every 8 (eight) hours. 09/10/19   Geoffery Lyonselo, Douglas, MD  atorvastatin (LIPITOR) 40 MG tablet Take 40 mg by mouth at bedtime. 06/24/19   [provider]  docusate sodium (COLACE) 250 MG capsule Take 1 capsule (250 mg total) by mouth daily. 12/05/18   Derwood KaplanNanavati, Ankit, MD  ezetimibe (ZETIA) 10 MG tablet Take 10 mg by mouth daily.    [provider]  hydrochlorothiazide (MICROZIDE) 12.5 MG capsule Take 12.5 mg by mouth daily.    [provider]  lisinopril (ZESTRIL) 5 MG tablet  02/19/19   [provider]  metFORMIN (GLUCOPHAGE) 500 MG tablet Take by mouth 2 (two) times daily with a meal.    [provider]  starch (ANUSOL) 51 % suppository Place 1 suppository rectally as needed for pain (no more than 5 days in a row). 12/05/18   Derwood KaplanNanavati, Ankit, MD    Physical Exam: There were no vitals filed for this visit.  Constitutional: NAD, calm  Eyes: PERTLA, lids and conjunctivae normal ENMT: Mucous membranes are moist. Posterior pharynx clear of any exudate or lesions.   Neck: normal, supple, no masses, no thyromegaly Respiratory: clear to auscultation bilaterally, no  wheezing, no crackles. No accessory muscle use.  Cardiovascular: S1 & S2 heard, regular rate and rhythm. No extremity edema.   Abdomen: No distension, no tenderness, soft. Bowel sounds normal.  Musculoskeletal: no clubbing / cyanosis. No joint deformity upper and lower extremities.   Skin: no significant rashes, lesions, ulcers. Warm, dry, well-perfused. Neurologic: CN 2-12 grossly intact. Sensation intact. Strength 5/5 in all 4 limbs.  Psychiatric: Alert and  oriented x 3. Very pleasant and cooperative.    Labs on Admission: I have personally reviewed following labs and imaging studies  CBC: Recent Labs  Lab 09/09/19 1055  WBC 3.4*  NEUTROABS 3.0  HGB 12.2*  HCT 37.8*  MCV 91.5  PLT 102*   Basic Metabolic Panel: Recent Labs  Lab 09/09/19 1055  NA 136  K 3.9  CL 100  CO2 23  GLUCOSE 199*  BUN 17  CREATININE 0.99  CALCIUM 9.0   GFR: Estimated Creatinine Clearance: 85.2 mL/min (by C-G formula based on SCr of 0.99 mg/dL). Liver Function Tests: Recent Labs  Lab 09/09/19 1055  AST 74*  ALT 105*  ALKPHOS 90  BILITOT 0.8  PROT 6.6  ALBUMIN 3.8   No results for input(s): LIPASE, AMYLASE in the last 168 hours. No results for input(s): AMMONIA in the last 168 hours. Coagulation Profile: No results for input(s): INR, PROTIME in the last 168 hours. Cardiac Enzymes: No results for input(s): CKTOTAL, CKMB, CKMBINDEX, TROPONINI in the last 168 hours. BNP (last 3 results) No results for input(s): PROBNP in the last 8760 hours. HbA1C: No results for input(s): HGBA1C in the last 72 hours. CBG: No results for input(s): GLUCAP in the last 168 hours. Lipid Profile: No results for input(s): CHOL, HDL, LDLCALC, TRIG, CHOLHDL, LDLDIRECT in the last 72 hours. Thyroid Function Tests: No results for input(s): TSH, T4TOTAL, FREET4, T3FREE, THYROIDAB in the last 72 hours. Anemia Panel: No results for input(s): VITAMINB12, FOLATE, FERRITIN, TIBC, IRON, RETICCTPCT in the last 72 hours. Urine analysis:    Component Value Date/Time   COLORURINE YELLOW 09/09/2019 1252   APPEARANCEUR CLEAR 09/09/2019 1252   LABSPEC 1.015 09/09/2019 1252   PHURINE 6.0 09/09/2019 1252   GLUCOSEU NEGATIVE 09/09/2019 1252   HGBUR NEGATIVE 09/09/2019 1252   BILIRUBINUR NEGATIVE 09/09/2019 1252   KETONESUR 15 (A) 09/09/2019 1252   PROTEINUR NEGATIVE 09/09/2019 1252   NITRITE NEGATIVE 09/09/2019 1252   LEUKOCYTESUR NEGATIVE 09/09/2019 1252   Sepsis Labs:  (procalcitonin:4,lacticidven:4) ) Recent Results (from the past 240 hour(s))  Blood culture (routine x 2)     Status: None (Preliminary result)   Collection Time: 09/09/19 11:13 AM   Specimen: BLOOD RIGHT ARM  Result Value Ref Range Status   Specimen Description   Final    BLOOD RIGHT ARM Performed at [facility], [address] [place], [place] [address]    Special Requests   Final    BOTTLES DRAWN AEROBIC AND ANAEROBIC Blood Culture adequate volume Performed at [facility], [address] [place], [place] [address]    Culture  Setup Time   Final    GRAM NEGATIVE RODS IN BOTH AEROBIC AND ANAEROBIC BOTTLES CRITICAL VALUE NOTED.  VALUE IS CONSISTENT WITH PREVIOUSLY REPORTED AND CALLED VALUE. Performed at [facility], [address] [place], [place] [address]    Culture GRAM NEGATIVE RODS  Final   Report Status PENDING  Incomplete  Blood culture (routine x 2)     Status: None (Preliminary result)  Collection Time: 09/09/19 11:13 AM   Specimen: BLOOD LEFT ARM  Result Value Ref Range Status   Specimen Description   Final    BLOOD LEFT ARM Performed at [facility], [address] [place], [place] [address]    Special Requests   Final    BOTTLES DRAWN AEROBIC AND ANAEROBIC Blood Culture adequate volume Performed at [facility], [address] [place], [place] [address]    Culture  Setup Time   Final    GRAM NEGATIVE RODS ANAEROBIC BOTTLE ONLY CRITICAL RESULT CALLED TO, READ BACK BY AND VERIFIED WITH: RN AT [facility] S GOUGE [id number] AT 1122 BY CM Performed at [facility], [address] [place], [place] [address]    Culture GRAM NEGATIVE RODS  Final   Report Status PENDING  Incomplete  SARS Coronavirus 2 [facility] order, Performed in [facility] hospital lab) Nasopharyngeal Nasopharyngeal Swab     Status: None   Collection Time: 09/09/19 11:13 AM   Specimen: Nasopharyngeal Swab  Result Value Ref Range  Status   SARS Coronavirus 2 NEGATIVE NEGATIVE Final    Comment: (NOTE) If result is NEGATIVE SARS-CoV-2 target nucleic acids are NOT DETECTED. The SARS-CoV-2 RNA is generally detectable in upper and lower  respiratory specimens during the acute phase of infection. The lowest  concentration of SARS-CoV-2 viral copies this assay can detect is 250  copies / mL. A negative result does not preclude SARS-CoV-2 infection  and should not be used as the sole basis for treatment or other  patient management decisions.  A negative result may occur with  improper specimen collection / handling, submission of specimen other  than nasopharyngeal swab, presence of viral mutation(s) within the  areas targeted by this assay, and inadequate number of viral copies  (<250 copies / mL). A negative result must be combined with clinical  observations, patient history, and epidemiological information. If result is POSITIVE SARS-CoV-2 target nucleic acids are DETECTED. The SARS-CoV-2 RNA is generally detectable in upper and lower  respiratory specimens dur ing the acute phase of infection.  Positive  results are indicative of active infection with SARS-CoV-2.  Clinical  correlation with patient history and other diagnostic information is  necessary to determine patient infection status.  Positive results do  not rule out bacterial infection or co-infection with other viruses. If result is PRESUMPTIVE POSTIVE SARS-CoV-2 nucleic acids MAY BE PRESENT.   A presumptive positive result was obtained on the submitted specimen  and confirmed on repeat testing.  While 2019 novel coronavirus  (SARS-CoV-2) nucleic acids may be present in the submitted sample  additional confirmatory testing may be necessary for epidemiological  and / or clinical management purposes  to differentiate between  SARS-CoV-2 and other Sarbecovirus currently known to infect humans.  If clinically indicated additional testing with an alternate  test  methodology [phone]) is advised. The SARS-CoV-2 RNA is generally  detectable in upper and lower respiratory sp ecimens during the acute  phase of infection. The expected result is Negative. Fact Sheet for Patients:  BoilerBrush.com.cy Fact Sheet for Healthcare Providers: https://pope.com/ This test is not yet approved or cleared by the [place] FDA and has been authorized for detection and/or diagnosis of SARS-CoV-2 by FDA under an Emergency Use Authorization (EUA).  This EUA will remain in effect (meaning this test can be used) for the duration of the COVID-19 declaration under Section 564(b)(1) of the Act, 21 U.S.C. section 360bbb-3(b)(1), unless the authorization  is terminated or revoked sooner. Performed at [facility], [address] [place], [place] [address]   Blood Culture ID Panel (Reflexed)     Status: Abnormal   Collection Time: 09/09/19 11:13 AM  Result Value Ref Range Status   Enterococcus species NOT DETECTED NOT DETECTED Final   Listeria monocytogenes NOT DETECTED NOT DETECTED Final   Staphylococcus species NOT DETECTED NOT DETECTED Final   Staphylococcus aureus (BCID) NOT DETECTED NOT DETECTED Final   Streptococcus species NOT DETECTED NOT DETECTED Final   Streptococcus agalactiae NOT DETECTED NOT DETECTED Final   Streptococcus pneumoniae NOT DETECTED NOT DETECTED Final   Streptococcus pyogenes NOT DETECTED NOT DETECTED Final   Acinetobacter baumannii NOT DETECTED NOT DETECTED Final   Enterobacteriaceae species DETECTED (A) NOT DETECTED Final    Comment: Enterobacteriaceae represent a large family of gram-negative bacteria, not a single organism. CRITICAL RESULT CALLED TO, READ BACK BY AND VERIFIED WITH: RN AT [facility] S GOUGE [id number] AT 1122 BY CM    Enterobacter cloacae complex NOT DETECTED NOT DETECTED Final   Escherichia coli NOT DETECTED NOT DETECTED Final   Klebsiella oxytoca NOT DETECTED  NOT DETECTED Final   Klebsiella pneumoniae DETECTED (A) NOT DETECTED Final    Comment: CRITICAL RESULT CALLED TO, READ BACK BY AND VERIFIED WITH: RN AT [facility] S GOUGE [id number] AT 1122 BY CM    Proteus species NOT DETECTED NOT DETECTED Final   Serratia marcescens NOT DETECTED NOT DETECTED Final   Carbapenem resistance NOT DETECTED NOT DETECTED Final   Haemophilus influenzae NOT DETECTED NOT DETECTED Final   Neisseria meningitidis NOT DETECTED NOT DETECTED Final   Pseudomonas aeruginosa NOT DETECTED NOT DETECTED Final   Candida albicans NOT DETECTED NOT DETECTED Final   Candida glabrata NOT DETECTED NOT DETECTED Final   Candida krusei NOT DETECTED NOT DETECTED Final   Candida parapsilosis NOT DETECTED NOT DETECTED Final   Candida tropicalis NOT DETECTED NOT DETECTED Final    Comment: Performed at [facility], [address] [place], [place] [address]  Urine culture     Status: Abnormal   Collection Time: 09/09/19 12:52 PM   Specimen: Urine, Clean Catch  Result Value Ref Range Status   Specimen Description   Final    URINE, CLEAN CATCH Performed at [facility], [address] [place], [place] [address]    Special Requests   Final    NONE Performed at [facility], [address] [place], [place] [address]    Culture (A)  Final    <10,000 COLONIES/mL INSIGNIFICANT GROWTH Performed at [facility], [address], [place], [place] [address]    Report Status 09/10/2019 FINAL  Final  SARS Coronavirus 2 [facility] order, Performed in [facility] hospital lab) Nasopharyngeal Nasopharyngeal Swab     Status: None   Collection Time: 09/10/19  5:12 AM   Specimen: Nasopharyngeal Swab  Result Value Ref Range Status   SARS Coronavirus 2 NEGATIVE NEGATIVE Final    Comment: (NOTE) If result is NEGATIVE SARS-CoV-2 target nucleic acids are NOT DETECTED. The SARS-CoV-2 RNA is generally detectable in upper and lower  respiratory specimens during the acute phase  of infection. The lowest  concentration of SARS-CoV-2 viral copies this assay can detect is 250  copies / mL. A negative result does not preclude SARS-CoV-2 infection  and should not be used as the sole basis for treatment or other  patient management decisions.  A negative  result may occur with  improper specimen collection / handling, submission of specimen other  than nasopharyngeal swab, presence of viral mutation(s) within the  areas targeted by this assay, and inadequate number of viral copies  (<250 copies / mL). A negative result must be combined with clinical  observations, patient history, and epidemiological information. If result is POSITIVE SARS-CoV-2 target nucleic acids are DETECTED. The SARS-CoV-2 RNA is generally detectable in upper and lower  respiratory specimens dur ing the acute phase of infection.  Positive  results are indicative of active infection with SARS-CoV-2.  Clinical  correlation with patient history and other diagnostic information is  necessary to determine patient infection status.  Positive results do  not rule out bacterial infection or co-infection with other viruses. If result is PRESUMPTIVE POSTIVE SARS-CoV-2 nucleic acids MAY BE PRESENT.   A presumptive positive result was obtained on the submitted specimen  and confirmed on repeat testing.  While 2019 novel coronavirus  (SARS-CoV-2) nucleic acids may be present in the submitted sample  additional confirmatory testing may be necessary for epidemiological  and / or clinical management purposes  to differentiate between  SARS-CoV-2 and other Sarbecovirus currently known to infect humans.  If clinically indicated additional testing with an alternate test  methodology [phone]) is advised. The SARS-CoV-2 RNA is generally  detectable in upper and lower respiratory sp ecimens during the acute  phase of infection. The expected result is Negative. Fact Sheet for Patients:   StrictlyIdeas.no Fact Sheet for Healthcare Providers: BankingDealers.co.za This test is not yet approved or cleared by the [place] FDA and has been authorized for detection and/or diagnosis of SARS-CoV-2 by FDA under an Emergency Use Authorization (EUA).  This EUA will remain in effect (meaning this test can be used) for the duration of the COVID-19 declaration under Section 564(b)(1) of the Act, 21 U.S.C. section 360bbb-3(b)(1), unless the authorization is terminated or revoked sooner. Performed at [facility], [address] [place], [place] [address]      Radiological Exams on Admission: Dg Chest [facility] 1 View  Result Date: 09/09/2019 CLINICAL DATA:  Fever EXAM: PORTABLE CHEST 1 VIEW COMPARISON:  None. FINDINGS: The heart size and mediastinal contours are within normal limits. There may be very minimal heterogeneous opacity at the left lung base, possibly atelectasis or scarring. The visualized skeletal structures are unremarkable. IMPRESSION: There may be very minimal heterogeneous opacity at the left lung base, possibly atelectasis or scarring. No definite acute airspace opacity in AP portable projection. Electronically Signed   By: Eddie Candle M.D.   On: 09/09/2019 11:49    EKG: Not performed.   Assessment/Plan   1. Klebsiella bacteremia  - Patient was seen in ED on 9/26 with general malaise after having an episode of abdominal pain 3 days earlier, was febrile and leukopenic with mild lactate elevation, was cultured and treated with broad-spectrum antibiotics, discharged with Augmentin, and called back for GNR from blood culture  - Klebsiella isolated with sensitivities pending  - Most likely source is genitourinary or biliary, and he was having generalized abdominal pain as first symptom but is completely non-tender on admission  - Start empiric meropenem pending sensitivities, repeat cultures    2. Lymphopenia;  thrombocytopenia  - WBC was 3,400 in ED with absolute lymphocytes 170  - Platelet count 102k in ED, previously normal  - Likely secondary to GNR bacteremia, will continue treatment as above and follow CBC's    3. Type II DM  -  No A1c on file  - Managed with metformin at home, held on admission  - Check CBG's and use a low-intensity SSI with Novolog while in hospital   4. Hypertension  - BP at goal, continue lisinopril, hold HCTZ initially while hydrating     PPE: Mask, face shield, patient wearing mask.  DVT prophylaxis: SCD's  Code Status: Full  Family Communication: Wife updated at bedside Consults called: None  Admission status: Inpatient. Patient has GNR bacteremia with unknown source. There is a high mortality rate from GNR bacteremia and patient will likely need sensitivities to be finalized and negative repeat cultures prior to safe discharge.     Briscoe Deutscher, MD [facility] Pager [phone]  If 7PM-7AM, please contact night-coverage [email or web address] Password [facility]  09/10/2019, 7:25 PM

## 2019-09-10 NOTE — Discharge Instructions (Addendum)
Begin taking Augmentin as prescribed.  Take Tylenol 1000 mg rotated with ibuprofen 600 mg every 4 hours as needed for fever.  We will call you if your cultures indicate you require further treatment.  Return to the emergency department if you develop difficulty breathing, severe chest pain, or other new and concerning symptoms.

## 2019-09-10 NOTE — ED Notes (Addendum)
Pt c/o "gas" type pain with burping.  MD updated. Orders received.

## 2019-09-10 NOTE — Plan of Care (Signed)

## 2019-09-10 NOTE — Progress Notes (Addendum)
Pharmacy Antibiotic Note  Arthur Spencer is a 73 y.o. male admitted on 09/10/2019 with fevers and found to have Klebsiella bacteremia.  Pharmacy has been consulted for meropenem dosing as MD is concerned for possible ESBL.  Plan: -Meropenem 1g IV q8h -Follow up culture and narrow as able     Temp (24hrs), Avg:99.5 F (37.5 C), Min:99.1 F (37.3 C), Max:99.9 F (37.7 C)  Recent Labs  Lab 09/09/19 1055 09/09/19 1310  WBC 3.4*  --   CREATININE 0.99  --   LATICACIDVEN 2.5* 1.3    Estimated Creatinine Clearance: 85.2 mL/min (by C-G formula based on SCr of 0.99 mg/dL).    No Known Allergies  Antimicrobials this admission: Vancomycin 9/26 x1 Zosyn 9/26 x1 Meropenem 9/27 >>  Microbiology results: 9/26 BCx: Klebsiella  Thank you for allowing pharmacy to be a part of this patient's care.   Arrie Senate, PharmD, BCPS Clinical Pharmacist [phone] Please check AMION for all [place] numbers 09/10/2019

## 2019-09-10 NOTE — Progress Notes (Addendum)
He is doing fine, per nursing.  Temp 99 since 0300 or longer. Lactate normalized.  No complaints overnight other than gas, given Tums.  COVID negative x 2.  Source uncertain, possible LLL PNA.  Will cancel PUI status.  I have requested that Dr. Guadalupe Dawn the patient to determine if admission is still indicated.  If so, patient is accepted to med tele.  Otherwise, may benefit from outpatient antibiotic treatment and PCP f/u this week.  Addendum: patient was discharged as discussed.  Unfortunately, shortly thereafter his blood cultures returned positive x 1 for GNR with Enterobacter.  As such, the patient was called back and asked to come in as a direct admit to 5N, med surg (given his clinical stability at the time of ER d/c).     Carlyon Shadow, M.D.

## 2019-09-10 NOTE — ED Notes (Signed)
Pt ambulated around the dept without difficulty. States he feels much better than when he arrived. O2 sats maintained at 96% during ambulation.

## 2019-09-11 ENCOUNTER — Encounter (HOSPITAL_COMMUNITY): Payer: Self-pay

## 2019-09-11 ENCOUNTER — Inpatient Hospital Stay (HOSPITAL_COMMUNITY): Payer: Medicare Other

## 2019-09-11 ENCOUNTER — Other Ambulatory Visit: Payer: Self-pay

## 2019-09-11 LAB — COMPREHENSIVE METABOLIC PANEL
ALT: 116 U/L — ABNORMAL HIGH (ref 0–44)
AST: 83 U/L — ABNORMAL HIGH (ref 15–41)
Albumin: 2.9 g/dL — ABNORMAL LOW (ref 3.5–5.0)
Alkaline Phosphatase: 77 U/L (ref 38–126)
Anion gap: 8 (ref 5–15)
BUN: 8 mg/dL (ref 8–23)
CO2: 24 mmol/L (ref 22–32)
Calcium: 8.8 mg/dL — ABNORMAL LOW (ref 8.9–10.3)
Chloride: 105 mmol/L (ref 98–111)
Creatinine, Ser: 0.79 mg/dL (ref 0.61–1.24)
GFR calc Af Amer: >60 mL/min (ref >60)
GFR calc non Af Amer: >60 mL/min (ref >60)
Glucose, Bld: 148 mg/dL — ABNORMAL HIGH (ref 70–99)
Potassium: 3.6 mmol/L (ref 3.5–5.1)
Sodium: 137 mmol/L (ref 135–145)
Total Bilirubin: 1.1 mg/dL (ref 0.3–1.2)
Total Protein: 5.7 g/dL — ABNORMAL LOW (ref 6.5–8.1)

## 2019-09-11 LAB — ABO/RH: ABO/RH(D): A POS

## 2019-09-11 LAB — CBC WITH DIFFERENTIAL/PLATELET
Abs Immature Granulocytes: 0.02 10*3/uL (ref 0.00–0.07)
Basophils Absolute: 0 10*3/uL (ref 0.0–0.1)
Basophils Relative: 0 %
Eosinophils Absolute: 0.1 10*3/uL (ref 0.0–0.5)
Eosinophils Relative: 1 %
HCT: 32.7 % — ABNORMAL LOW (ref 39.0–52.0)
Hemoglobin: 11.4 g/dL — ABNORMAL LOW (ref 13.0–17.0)
Immature Granulocytes: 0 %
Lymphocytes Relative: 18 %
Lymphs Abs: 1 10*3/uL (ref 0.7–4.0)
MCH: 31 pg (ref 26.0–34.0)
MCHC: 34.9 g/dL (ref 30.0–36.0)
MCV: 88.9 fL (ref 80.0–100.0)
Monocytes Absolute: 0.7 10*3/uL (ref 0.1–1.0)
Monocytes Relative: 13 %
Neutro Abs: 3.8 10*3/uL (ref 1.7–7.7)
Neutrophils Relative %: 68 %
Platelets: 120 10*3/uL — ABNORMAL LOW (ref 150–400)
RBC: 3.68 MIL/uL — ABNORMAL LOW (ref 4.22–5.81)
RDW: 14.2 % (ref 11.5–15.5)
WBC: 5.6 10*3/uL (ref 4.0–10.5)
nRBC: 0 % (ref 0.0–0.2)

## 2019-09-11 LAB — GLUCOSE, CAPILLARY
Glucose-Capillary: 148 mg/dL — ABNORMAL HIGH (ref 70–99)
Glucose-Capillary: 116 mg/dL — ABNORMAL HIGH (ref 70–99)
Glucose-Capillary: 154 mg/dL — ABNORMAL HIGH (ref 70–99)
Glucose-Capillary: 106 mg/dL — ABNORMAL HIGH (ref 70–99)

## 2019-09-11 MED ORDER — IOHEXOL 300 MG/ML  SOLN
100.0000 mL | Freq: Once | INTRAMUSCULAR | Status: AC | PRN
  Administered 2019-09-11: 100 mL via INTRAVENOUS

## 2019-09-11 NOTE — Progress Notes (Signed)
[facility] PROGRESS NOTE  Arthur SacksRhoi Bevacqua ZOX:[id number] DOB: 06/05/1946 DOA: 09/10/2019 PCP: Caffie DammeSmith, Karla, MD  Brief summary   Arthur Spencer is a 73 y.o. male with medical history significant for type 2 diabetes mellitus, hypertension, and hyperlipidemia, now returning to the hospital for admission for positive blood culture from yesterday.  Patient had been in his usual state of health until 09/06/2019 when he developed acute onset of generalized abdominal pain, nausea, and malaise.  The abdominal pain resolved, but he continued to feel generally weak.  He denies vomiting, diarrhea, or dysuria.  He also denies flank pain.  He has a chronic cough that he attributes to sinus problems, but denies any change in that or new shortness of breath.  Patient had an office-based procedure 2 months ago, bilateral resection of inferior turbinates, and has not had any issues from that.  He was seen in the ED yesterday, cultures were drawn, he received broad-spectrum antibiotics, felt better, and was discharged with Augmentin for possible CAP.  Blood cultures grew Klebsiella, sensitivities pending, and the patient was called back and instructed to present to the hospital for direct admission.   On arrival, patient is afebrile with normal heart rate and normal blood pressure, saturating well on room air, appears fatigued, but in no distress.  Assessment/Plan:  Klebsiella bacteremia  - Patient was seen in ED on 9/26 with general malaise after having an episode of abdominal pain 3 days earlier, was febrile and leukopenic with mild lactate elevation, was cultured and treated with broad-spectrum antibiotics, discharged with Augmentin, and called back for GNR from blood culture  - source is unclear. Will obtain ct abd due to gi symptoms, mild elevated LFTs. obtain ct sinuses due to recent surgery/persistent nasal drainage.  - cont empiric meropenem pending sensitivities, repeat cultures    Lymphopenia;  thrombocytopenia - WBC was 3,400 in ED with absolute lymphocytes 170  - Platelet count 102k in ED, previously normal  - Likely secondary to GNR bacteremia, will continue treatment as above and follow CBC's    Type II DM  - A1c 6.8 - Managed with metformin at home, held on admission  - monitor on ISS  Hypertension  - BP at goal, hold lisinopril, hold HCTZ due to contrast study,     Code Status: full Family Communication: d/w patient, his family, Charity fundraiserN (indicate person spoken with, relationship, and if by phone, the number) Disposition Plan: remains inpatient    Consultants:  none  Procedures:  none  Antibiotics: Anti-infectives (From admission, onward)   Start     Dose/Rate Route Frequency Ordered Stop   09/10/19 2030  meropenem (MERREM) 1 g in sodium chloride 0.9 % 100 mL IVPB     1 g 200 mL/hr over 30 Minutes Intravenous Every 8 hours 09/10/19 1941          (indicate start date, and stop date if known)  HPI/Subjective: Reports mild right sided abdominal discomfort. No vomiting. Reports nasal drainage . No cough   Objective: Vitals:   09/11/19 0441 09/11/19 0752  BP: (!) 150/64 139/65  Pulse: 70 (!) 59  Resp: 18 16  Temp: 98.7 F (37.1 C) 98.3 F (36.8 C)  SpO2: 97% 97%    Intake/Output Summary (Last 24 hours) at 09/11/2019 1249 Last data filed at 09/11/2019 0900 Gross per 24 hour  Intake 580 ml  Output -  Net 580 ml   Filed Weights   09/11/19 0100  Weight: 106 kg    Exam:   General:  No distress   Cardiovascular: s1,s2 rrr  Respiratory: CTA BL  Abdomen: soft, mild right sided tenderness.   Musculoskeletal: no leg edema    Data Reviewed: Basic Metabolic Panel: Recent Labs  Lab 09/09/19 1055 09/10/19 2014 09/11/19 0329  NA 136 135 137  K 3.9 3.6 3.6  CL 100 101 105  CO2 23 22 24   GLUCOSE 199* 126* 148*  BUN 17 9 8   CREATININE 0.99 0.97 0.79  CALCIUM 9.0 9.1 8.8*   Liver Function Tests: Recent Labs  Lab 09/09/19 1055  09/10/19 2014 09/11/19 0329  AST 74* 95* 83*  ALT 105* 129* 116*  ALKPHOS 90 86 77  BILITOT 0.8 0.7 1.1  PROT 6.6 6.6 5.7*  ALBUMIN 3.8 3.4* 2.9*   No results for input(s): LIPASE, AMYLASE in the last 168 hours. No results for input(s): AMMONIA in the last 168 hours. CBC: Recent Labs  Lab 09/09/19 1055 09/10/19 2014 09/11/19 0329  WBC 3.4* 6.7 5.6  NEUTROABS 3.0 4.8 3.8  HGB 12.2* 12.3* 11.4*  HCT 37.8* 36.1* 32.7*  MCV 91.5 89.6 88.9  PLT 102* 117* 120*   Cardiac Enzymes: No results for input(s): CKTOTAL, CKMB, CKMBINDEX, TROPONINI in the last 168 hours. BNP (last 3 results) No results for input(s): BNP in the last 8760 hours.  ProBNP (last 3 results) No results for input(s): PROBNP in the last 8760 hours.  CBG: Recent Labs  Lab 09/10/19 1933 09/10/19 2157 09/11/19 0639  GLUCAP 125* 129* 148*    Recent Results (from the past 240 hour(s))  Blood culture (routine x 2)     Status: None (Preliminary result)   Collection Time: 09/09/19 11:13 AM   Specimen: BLOOD RIGHT ARM  Result Value Ref Range Status   Specimen Description   Final    BLOOD RIGHT ARM Performed at [facility], [address] [place], [address] [place]    Special Requests   Final    BOTTLES DRAWN AEROBIC AND ANAEROBIC Blood Culture adequate volume Performed at [facility], [address] [place], [address] [place]    Culture  Setup Time   Final    GRAM NEGATIVE RODS IN BOTH AEROBIC AND ANAEROBIC BOTTLES CRITICAL VALUE NOTED.  VALUE IS CONSISTENT WITH PREVIOUSLY REPORTED AND CALLED VALUE.    Culture   Final    GRAM NEGATIVE RODS IDENTIFICATION AND SUSCEPTIBILITIES TO FOLLOW Performed at [facility], [address] [place], [address] [place]    Report Status PENDING  Incomplete  Blood culture (routine x 2)     Status: Abnormal (Preliminary result)   Collection Time: 09/09/19 11:13 AM   Specimen: BLOOD LEFT ARM  Result Value Ref Range Status   Specimen  Description   Final    BLOOD LEFT ARM Performed at [facility], [address] [place], [facility] [place]    Special Requests   Final    BOTTLES DRAWN AEROBIC AND ANAEROBIC Blood Culture adequate volume Performed at [facility], [address] [place], [address] [place]    Culture  Setup Time   Final    GRAM NEGATIVE RODS IN BOTH AEROBIC AND ANAEROBIC BOTTLES CRITICAL RESULT CALLED TO, READ BACK BY AND VERIFIED WITH: RN AT [facility] [place] AT 1122 BY CM    Culture (A)  Final    KLEBSIELLA PNEUMONIAE SUSCEPTIBILITIES TO FOLLOW Performed at [facility], [address], [place], [address] [place]  Report Status PENDING  Incomplete  SARS Coronavirus 2 [facility] order, Performed in [facility] hospital lab) Nasopharyngeal Nasopharyngeal Swab     Status: None   Collection Time: 09/09/19 11:13 AM   Specimen: Nasopharyngeal Swab  Result Value Ref Range Status   SARS Coronavirus 2 NEGATIVE NEGATIVE Final    Comment: (NOTE) If result is NEGATIVE SARS-CoV-2 target nucleic acids are NOT DETECTED. The SARS-CoV-2 RNA is generally detectable in upper and lower  respiratory specimens during the acute phase of infection. The lowest  concentration of SARS-CoV-2 viral copies this assay can detect is 250  copies / mL. A negative result does not preclude SARS-CoV-2 infection  and should not be used as the sole basis for treatment or other  patient management decisions.  A negative result may occur with  improper specimen collection / handling, submission of specimen other  than nasopharyngeal swab, presence of viral mutation(s) within the  areas targeted by this assay, and inadequate number of viral copies  (<250 copies / mL). A negative result must be combined with clinical  observations, patient history, and epidemiological information. If result is POSITIVE SARS-CoV-2 target nucleic acids are DETECTED. The SARS-CoV-2 RNA is generally detectable in upper and  lower  respiratory specimens dur ing the acute phase of infection.  Positive  results are indicative of active infection with SARS-CoV-2.  Clinical  correlation with patient history and other diagnostic information is  necessary to determine patient infection status.  Positive results do  not rule out bacterial infection or co-infection with other viruses. If result is PRESUMPTIVE POSTIVE SARS-CoV-2 nucleic acids MAY BE PRESENT.   A presumptive positive result was obtained on the submitted specimen  and confirmed on repeat testing.  While 2019 novel coronavirus  (SARS-CoV-2) nucleic acids may be present in the submitted sample  additional confirmatory testing may be necessary for epidemiological  and / or clinical management purposes  to differentiate between  SARS-CoV-2 and other Sarbecovirus currently known to infect humans.  If clinically indicated additional testing with an alternate test  methodology [phone]) is advised. The SARS-CoV-2 RNA is generally  detectable in upper and lower respiratory sp ecimens during the acute  phase of infection. The expected result is Negative. Fact Sheet for Patients:  StrictlyIdeas.no Fact Sheet for Healthcare Providers: BankingDealers.co.za This test is not yet approved or cleared by the [place] FDA and has been authorized for detection and/or diagnosis of SARS-CoV-2 by FDA under an Emergency Use Authorization (EUA).  This EUA will remain in effect (meaning this test can be used) for the duration of the COVID-19 declaration under Section 564(b)(1) of the Act, 21 U.S.C. section 360bbb-3(b)(1), unless the authorization is terminated or revoked sooner. Performed at [facility], [address] [place], [place] [address]   Blood Culture ID Panel (Reflexed)     Status: Abnormal   Collection Time: 09/09/19 11:13 AM  Result Value Ref Range Status   Enterococcus species NOT DETECTED  NOT DETECTED Final   Listeria monocytogenes NOT DETECTED NOT DETECTED Final   Staphylococcus species NOT DETECTED NOT DETECTED Final   Staphylococcus aureus (BCID) NOT DETECTED NOT DETECTED Final   Streptococcus species NOT DETECTED NOT DETECTED Final   Streptococcus agalactiae NOT DETECTED NOT DETECTED Final   Streptococcus pneumoniae NOT DETECTED NOT DETECTED Final   Streptococcus pyogenes NOT DETECTED NOT DETECTED Final   Acinetobacter baumannii NOT DETECTED NOT DETECTED Final   Enterobacteriaceae species DETECTED (A) NOT DETECTED Final    Comment: Enterobacteriaceae  represent a large family of gram-negative bacteria, not a single organism. CRITICAL RESULT CALLED TO, READ BACK BY AND VERIFIED WITH: RN AT [facility] [id number] AT 1122 BY CM    Enterobacter cloacae complex NOT DETECTED NOT DETECTED Final   Escherichia coli NOT DETECTED NOT DETECTED Final   Klebsiella oxytoca NOT DETECTED NOT DETECTED Final   Klebsiella pneumoniae DETECTED (A) NOT DETECTED Final    Comment: CRITICAL RESULT CALLED TO, READ BACK BY AND VERIFIED WITH: RN AT [facility] S [facility] [id number] AT 1122 BY CM    Proteus species NOT DETECTED NOT DETECTED Final   Serratia marcescens NOT DETECTED NOT DETECTED Final   Carbapenem resistance NOT DETECTED NOT DETECTED Final   Haemophilus influenzae NOT DETECTED NOT DETECTED Final   Neisseria meningitidis NOT DETECTED NOT DETECTED Final   Pseudomonas aeruginosa NOT DETECTED NOT DETECTED Final   Candida albicans NOT DETECTED NOT DETECTED Final   Candida glabrata NOT DETECTED NOT DETECTED Final   Candida krusei NOT DETECTED NOT DETECTED Final   Candida parapsilosis NOT DETECTED NOT DETECTED Final   Candida tropicalis NOT DETECTED NOT DETECTED Final    Comment: Performed at [facility], [address] [place], [place] [address]  Urine culture     Status: Abnormal   Collection Time: 09/09/19 12:52 PM   Specimen: Urine, Clean Catch  Result Value Ref Range Status   Specimen  Description   Final    URINE, CLEAN CATCH Performed at [facility], [address] [place], [place] [address]    Special Requests   Final    NONE Performed at [facility], [address] [place], [place] [address]    Culture (A)  Final    <10,000 COLONIES/mL INSIGNIFICANT GROWTH Performed at [facility], [address] [place], [place] [address]    Report Status 09/10/2019 FINAL  Final  SARS Coronavirus 2 [facility] order, Performed in [place] hospital lab) Nasopharyngeal Nasopharyngeal Swab     Status: None   Collection Time: 09/10/19  5:12 AM   Specimen: Nasopharyngeal Swab  Result Value Ref Range Status   SARS Coronavirus 2 NEGATIVE NEGATIVE Final    Comment: (NOTE) If result is NEGATIVE SARS-CoV-2 target nucleic acids are NOT DETECTED. The SARS-CoV-2 RNA is generally detectable in upper and lower  respiratory specimens during the acute phase of infection. The lowest  concentration of SARS-CoV-2 viral copies this assay can detect is 250  copies / mL. A negative result does not preclude SARS-CoV-2 infection  and should not be used as the sole basis for treatment or other  patient management decisions.  A negative result may occur with  improper specimen collection / handling, submission of specimen other  than nasopharyngeal swab, presence of viral mutation(s) within the  areas targeted by this assay, and inadequate number of viral copies  (<250 copies / mL). A negative result must be combined with clinical  observations, patient history, and epidemiological information. If result is POSITIVE SARS-CoV-2 target nucleic acids are DETECTED. The SARS-CoV-2 RNA is generally detectable in upper and lower  respiratory specimens dur ing the acute phase of infection.  Positive  results are indicative of active infection with SARS-CoV-2.  Clinical  correlation with patient history and other diagnostic information is  necessary to determine patient  infection status.  Positive results do  not rule out bacterial infection or co-infection with other viruses. If result is PRESUMPTIVE POSTIVE SARS-CoV-2 nucleic acids MAY BE  PRESENT.   A presumptive positive result was obtained on the submitted specimen  and confirmed on repeat testing.  While 2019 novel coronavirus  (SARS-CoV-2) nucleic acids may be present in the submitted sample  additional confirmatory testing may be necessary for epidemiological  and / or clinical management purposes  to differentiate between  SARS-CoV-2 and other Sarbecovirus currently known to infect humans.  If clinically indicated additional testing with an alternate test  methodology [phone]) is advised. The SARS-CoV-2 RNA is generally  detectable in upper and lower respiratory sp ecimens during the acute  phase of infection. The expected result is Negative. Fact Sheet for Patients:  BoilerBrush.com.cy Fact Sheet for Healthcare Providers: https://pope.com/ This test is not yet approved or cleared by the [place] FDA and has been authorized for detection and/or diagnosis of SARS-CoV-2 by FDA under an Emergency Use Authorization (EUA).  This EUA will remain in effect (meaning this test can be used) for the duration of the COVID-19 declaration under Section 564(b)(1) of the Act, 21 U.S.C. section 360bbb-3(b)(1), unless the authorization is terminated or revoked sooner. Performed at [facility], [address] [place], [place] [address]   Culture, blood (routine x 2)     Status: None (Preliminary result)   Collection Time: 09/10/19  8:06 PM   Specimen: BLOOD  Result Value Ref Range Status   Specimen Description BLOOD RIGHT ANTECUBITAL  Final   Special Requests   Final    BOTTLES DRAWN AEROBIC AND ANAEROBIC Blood Culture adequate volume   Culture   Final    NO GROWTH < 24 HOURS Performed at [facility], [address]  [place], [place] [address]    Report Status PENDING  Incomplete  Culture, blood (routine x 2)     Status: None (Preliminary result)   Collection Time: 09/10/19  8:14 PM   Specimen: BLOOD  Result Value Ref Range Status   Specimen Description BLOOD LEFT ANTECUBITAL  Final   Special Requests   Final    BOTTLES DRAWN AEROBIC ONLY Blood Culture adequate volume   Culture   Final    NO GROWTH < 24 HOURS Performed at [facility], [address] [place], [place] [address]    Report Status PENDING  Incomplete     Studies: No results found.  Scheduled Meds: . atorvastatin  40 mg Oral QHS  . insulin aspart  0-5 Units Subcutaneous QHS  . insulin aspart  0-9 Units Subcutaneous TID WC  . lisinopril  5 mg Oral Daily   Continuous Infusions: . sodium chloride    . meropenem (MERREM) IV 1 g (09/11/19 0546)    Principal Problem:   Bacteremia due to Gram-negative bacteria Active Problems:   DM (diabetes mellitus), type 2 (HCC)   Lymphopenia   Thrombocytopenia (HCC)    Time spent: >25 minutes     Esperanza Sheets  [facility] Pager [phone]. If 7PM-7AM, please contact night-coverage at [email or web address], password [facility] 09/11/2019, 12:49 PM  LOS: 1 day

## 2019-09-12 LAB — CULTURE, BLOOD (ROUTINE X 2)
Special Requests: ADEQUATE
Special Requests: ADEQUATE

## 2019-09-12 LAB — GLUCOSE, CAPILLARY
Glucose-Capillary: 140 mg/dL — ABNORMAL HIGH (ref 70–99)
Glucose-Capillary: 157 mg/dL — ABNORMAL HIGH (ref 70–99)
Glucose-Capillary: 173 mg/dL — ABNORMAL HIGH (ref 70–99)
Glucose-Capillary: 237 mg/dL — ABNORMAL HIGH (ref 70–99)

## 2019-09-12 MED ORDER — SODIUM CHLORIDE 0.9 % IV SOLN
2.0000 g | INTRAVENOUS | Status: DC
  Administered 2019-09-12 – 2019-09-14 (×4): 2 g via INTRAVENOUS
  Filled 2019-09-12 (×4): qty 20

## 2019-09-12 NOTE — Consult Note (Signed)
[facility]  Referring Provider: Dr. Isabell JarvisUriev [facility] Primary Care Physician:  Caffie DammeSmith, Karla, MD  Reason for Consultation:  Abnormal CT scan, Klebsiella bacteremia  HPI: Arthur SacksRhoi Spencer is a 73 y.o. male whom I was asked to see for above reasons.  Patient recently relocated here from the [place] area with his wife.  He started feeling ill last week, with non-specific malaise followed few days ago by lower abdominal discomfort and fevers and chills and rigors.  He had no nausea, vomiting, blood in stool.  Has intermittent troubles with constipation.  Had CT scan showing inflammatory changes in right lower quadrant.  Patient has history of several prior colonoscopies, last in 2015 with polyps removed, as well as diverticulosis.  Found to have Klebsiella bacteremia.  Since receiving IV hydration and starting on antibiotics, patient tells me he feels much better, but is not back to baseline.   Past Medical History:  Diagnosis Date  . Diabetes mellitus without complication (HCC)   . Epididymitis 02/13/2018  . High cholesterol   . Hypertension     Past Surgical History:  Procedure Laterality Date  . INGUINAL HERNIA REPAIR     As infant  . LUMBAR SPINE SURGERY  2009   discectomy  . LUMBAR SPINE SURGERY  2015   enlarged nerve canal   . TONSILLECTOMY      Prior to Admission medications   Medication Sig Start Date End Date Taking? Authorizing Provider  amoxicillin-clavulanate (AUGMENTIN) 500-125 MG tablet Take 1 tablet (500 mg total) by mouth every 8 (eight) hours. 09/10/19  Yes Delo, Riley Lamouglas, MD  APPLE CIDER VINEGAR PO Take 1 capsule by mouth daily.   Yes [provider]  Ascorbic Acid (VITAMIN C) 100 MG tablet Take 100 mg by mouth daily.   Yes [provider]  atorvastatin (LIPITOR) 40 MG tablet Take 40 mg by mouth at bedtime. 06/24/19  Yes [provider]  Chromium 1 MG CAPS Take 1 mg by mouth daily.   Yes [provider]  Cinnamon 500 MG  capsule Take 500 mg by mouth daily.   Yes [provider]  ezetimibe (ZETIA) 10 MG tablet Take 10 mg by mouth daily.   Yes [provider]  hydrochlorothiazide (HYDRODIURIL) 25 MG tablet Take 25 mg by mouth daily.    Yes [provider]  lisinopril (ZESTRIL) 5 MG tablet Take 5 mg by mouth daily.  02/19/19  Yes [provider]  metFORMIN (GLUCOPHAGE) 500 MG tablet Take 500 mg by mouth 2 (two) times daily with a meal.    Yes [provider]  niacin 100 MG tablet Take 100 mg by mouth at bedtime.   Yes [provider]  Omega-3 Fatty Acids (FISH OIL) 1000 MG CAPS Take 1 capsule by mouth daily.   Yes [provider]  saw palmetto 160 MG capsule Take 160 mg by mouth daily.   Yes [provider]  docusate sodium (COLACE) 250 MG capsule Take 1 capsule (250 mg total) by mouth daily. Patient not taking: Reported on 09/11/2019 12/05/18   Derwood KaplanNanavati, Ankit, MD    Current Facility-Administered Medications  Medication Dose Route Frequency Provider Last Rate Last Dose  . 0.9 %  sodium chloride infusion  250 mL Intravenous PRN Opyd, Lavone Neriimothy S, MD      . acetaminophen (TYLENOL) tablet 650 mg  650 mg Oral Q6H PRN Opyd, Lavone Neriimothy S, MD       Or  . acetaminophen (TYLENOL) suppository 650 mg  650  mg Rectal Q6H PRN Opyd, Lavone Neri, MD      . atorvastatin (LIPITOR) tablet 40 mg  40 mg Oral QHS Opyd, Lavone Neri, MD   40 mg at 09/11/19 2150  . bisacodyl (DULCOLAX) EC tablet 5 mg  5 mg Oral Daily PRN Opyd, Lavone Neri, MD      . cefTRIAXone (ROCEPHIN) 2 g in sodium chloride 0.9 % 100 mL IVPB  2 g Intravenous Q24H Esperanza Sheets, MD 200 mL/hr at 09/12/19 1133 2 g at 09/12/19 1133  . HYDROcodone-acetaminophen (NORCO/VICODIN) 5-325 MG per tablet 1-2 tablet  1-2 tablet Oral Q4H PRN Opyd, Lavone Neri, MD      . insulin aspart (novoLOG) injection 0-5 Units  0-5 Units Subcutaneous QHS Opyd, Timothy S, MD      . insulin aspart (novoLOG) injection 0-9 Units  0-9  Units Subcutaneous TID WC Opyd, Lavone Neri, MD   1 Units at 09/12/19 0751  . ondansetron (ZOFRAN) tablet 4 mg  4 mg Oral Q6H PRN Opyd, Lavone Neri, MD       Or  . ondansetron (ZOFRAN) injection 4 mg  4 mg Intravenous Q6H PRN Opyd, Lavone Neri, MD      . senna-docusate (Senokot-S) tablet 1 tablet  1 tablet Oral QHS PRN Opyd, Lavone Neri, MD      . sodium chloride flush (NS) 0.9 % injection 3 mL  3 mL Intravenous PRN Opyd, Lavone Neri, MD        Allergies as of 09/10/2019  . (No Known Allergies)    History reviewed. No pertinent family history.  Social History   Socioeconomic History  . Marital status: Married    Spouse name: Not on file  . Number of children: Not on file  . Years of education: Not on file  . Highest education level: Not on file  Occupational History  . Not on file  Social Needs  . Financial resource strain: Not on file  . Food insecurity    Worry: Not on file    Inability: Not on file  . Transportation needs    Medical: Not on file    Non-medical: Not on file  Tobacco Use  . Smoking status: Never Smoker  . Smokeless tobacco: Never Used  Substance and Sexual Activity  . Alcohol use: Not Currently    Frequency: Never  . Drug use: Never  . Sexual activity: Not on file  Lifestyle  . Physical activity    Days per week: Not on file    Minutes per session: Not on file  . Stress: Not on file  Relationships  . Social Musician on phone: Not on file    Gets together: Not on file    Attends religious service: Not on file    Active member of club or organization: Not on file    Attends meetings of clubs or organizations: Not on file    Relationship status: Not on file  . Intimate partner violence    Fear of current or ex partner: Not on file    Emotionally abused: Not on file    Physically abused: Not on file    Forced sexual activity: Not on file  Other Topics Concern  . Not on file  Social History Narrative  . Not on file    Review of Systems:  As per HPI, all others negative  Physical Exam: Vital signs in last 24 hours: Temp:  [98.6 F (37 C)-99.4 F (37.4 C)] 98.6  F (37 C) (09/29 0803) Pulse Rate:  [56-69] 63 (09/29 0803) Resp:  [16-18] 18 (09/29 0803) BP: (139-160)/(65-80) 160/69 (09/29 0803) SpO2:  [98 %-100 %] 99 % (09/29 0803) Last BM Date: 09/10/19 General:   Alert,  Well-developed, well-nourished, pleasant and cooperative in NAD Head:  Normocephalic and atraumatic. Eyes:  Sclera clear, no icterus.   Conjunctiva pink. Ears:  Normal auditory acuity. Nose:  No deformity, discharge,  or lesions. Mouth:  No deformity or lesions.  Oropharynx pink & moist. Neck:  Supple; no masses or thyromegaly. Lungs:  Clear throughout to auscultation.   No wheezes, crackles, or rhonchi. No acute distress. Heart:  Regular rate and rhythm; no murmurs, clicks, rubs,  or gallops. Abdomen:  Soft, mild lower abdominal tenderness, and nondistended. No masses, hepatosplenomegaly or hernias noted. Normal bowel sounds, without guarding, and without rebound.     Msk:  Symmetrical without gross deformities. Normal posture. Pulses:  Normal pulses noted. Extremities:  Without clubbing or edema. Neurologic:  Alert and  oriented x4;  grossly normal neurologically. Skin:  Intact without significant lesions or rashes. Psych:  Alert and cooperative. Normal mood and affect.   Lab Results: Recent Labs    09/10/19 2014 09/11/19 0329  WBC 6.7 5.6  HGB 12.3* 11.4*  HCT 36.1* 32.7*  PLT 117* 120*   BMET Recent Labs    09/10/19 2014 09/11/19 0329  NA 135 137  K 3.6 3.6  CL 101 105  CO2 22 24  GLUCOSE 126* 148*  BUN 9 8  CREATININE 0.97 0.79  CALCIUM 9.1 8.8*   LFT Recent Labs    09/11/19 0329  PROT 5.7*  ALBUMIN 2.9*  AST 83*  ALT 116*  ALKPHOS 77  BILITOT 1.1   PT/INR No results for input(s): LABPROT, INR in the last 72 hours.  Studies/Results: Ct Abdomen Pelvis W Contrast  Addendum Date: 09/11/2019   ADDENDUM REPORT:  09/11/2019 22:04 ADDENDUM: These results were called by telephone on 09/11/2019 at 10:04 pm to provider Chaney Malling , who verbally acknowledged these results. Electronically Signed   By: Lovena Le M.D.   On: 09/11/2019 22:04   Result Date: 09/11/2019 CLINICAL DATA:  Generalized abdominal pain, nausea and malaise EXAM: CT ABDOMEN AND PELVIS WITH CONTRAST TECHNIQUE: Multidetector CT imaging of the abdomen and pelvis was performed using the standard protocol following bolus administration of intravenous contrast. CONTRAST:  195mL OMNIPAQUE IOHEXOL 300 MG/ML  SOLN COMPARISON:  CT 12/05/2018 FINDINGS: Lower chest: Bandlike areas of scarring and/or atelectasis in the lung bases. Normal heart size. No pericardial effusion. Hepatobiliary: No focal liver abnormality is seen. No gallstones, gallbladder wall thickening, or biliary dilatation. Pancreas: Unremarkable. No pancreatic ductal dilatation or surrounding inflammatory changes. Spleen: Normal in size without focal abnormality. Adrenals/Urinary Tract: There is calcification involving the medial limb of the right adrenal gland which could reflect prior adrenal hemorrhage or sequela of remote infection. The body of the right adrenal gland and the left adrenal gland have a more normal appearance. Few areas of cortical scarring involving the left kidney are similar to prior as is mild symmetric perinephric stranding. Kidneys enhance symmetrically. No concerning renal lesions or urolithiasis. No hydronephrosis. The bladder is circumferentially thickened with perivesicular hazy stranding. Stomach/Bowel: Distal esophagus, stomach and duodenal sweep are unremarkable. Proximal small bowel is free of abnormality. There is mild thickening of the terminal ileum and cecum in the right lower quadrant but with notable stranding and inflammation centered predominantly on a draining, slightly dilated mesenteric venous branch,  which can be followed proximally to the superior  mesenteric vein. A normal appendix is visualized. Scattered colonic diverticula without focal pericolonic inflammation to suggest diverticulitis. No other colonic dilatation or wall thickening. Vascular/Lymphatic: Stranding and thickening appears centered predominantly upon a at somewhat dilated mesenteric venous branch which courses to the inflamed loops of small bowel and cecum in the right lower quadrant. Surrounding mesenteric adenopathy is present as well. Atherosclerotic plaque within the normal caliber aorta. No pathologically enlarged lymph nodes. Reproductive: The prostate and seminal vesicles are unremarkable. Other: Trace reactive free fluid noted in the right pericolic gutter tracking to the right pelvis. No free intraperitoneal air. No organized collection or abscess. No bowel containing hernia. Small fat containing umbilical hernia and bilateral fat containing inguinal hernias. 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 IMPRESSION: 1. Mild thickening of the terminal ileum and cecum in the right lower quadrant with stranding and thickening centered predominantly upon the draining mesenteric venous branch. Surrounding mesenteric adenopathy is present as well. While findings are nonspecific, overall appearance raises concern for potential veno-occlusive mesenteric ischemia versus an enterocolitis. 2. Circumferentially thickened bladder with perivesicular hazy stranding, suggestive of cystitis. Correlate with urinalysis. 3. Aortic Atherosclerosis (ICD10-I70.0). Electronically Signed: By: Kreg Shropshire M.D. On: 09/11/2019 21:47   Ct Maxillofacial Ltd Wo Cm  Result Date: 09/11/2019 CLINICAL DATA:  Sinusitis, malaise EXAM: CT PARANASAL SINUS LIMITED WITHOUT CONTRAST TECHNIQUE: Multidetector CT images of the paranasal sinuses were obtained using the standard protocol without  intravenous contrast. COMPARISON:  None. FINDINGS: Paranasal sinuses: Frontal: Normally aerated. Patent frontal sinus drainage pathways. Ethmoid: Normally aerated. Maxillary: Normally aerated. Sphenoid: Normally aerated. Patent sphenoethmoidal recesses. Sphenoid sinus septum inserts eccentric Lea into the left lateral wall of the sphenoid sinus near the carotid canal. Right ostiomeatal unit: Patent. Left ostiomeatal unit: Patent. Nasal passages: Patent. Asymmetric mucosal fullness the left turbinates likely related to the normal nasal cycle.Leftward nasal septal deviation with a contacting nasal septal spur. Anatomy: No pneumatization superior to anterior ethmoid notches. Symmetric and intact olfactory grooves and fovea ethmoidalis, Keros I (1-25mm). Sellar sphenoid pneumatization pattern. No dehiscence of carotid or optic canals. No onodi cell. Other: Orbits and intracranial compartment are unremarkable. Visible mastoid air cells are normally aerated. There is extensive pneumatization of the petrous apices. Middle ear cavities are clear. There is debris within both external auditory canals. IMPRESSION: Normally aerated paranasal sinuses. Patent sinus drainage pathways. Debris in the external auditory canals, likely cerumen impaction. Leftward nasal septal deviation with a contacting left nasal septal spur. Pneumatization of the petrous apices. Electronically Signed   By: Kreg Shropshire M.D.   On: 09/11/2019 21:55   Assessment:  1.  Klebsiella bacteremia with lower abdominal pain, fevers, chills, rigors. 2.  Abnormal CT scan, right lower quadrant inflammatory changes. 3.  Significant improvement with antibiotics and hydration.  Suspect from cecal/ascending colon diverticulitis.  Symptoms rather abrupt for inflammatory bowel disease.  Spontaneous bowel infection of colon isolated in proximal colon is not typical for Klebsiella.  Clinically symptoms not atypical for mesenteric ischemia, but isolated involvement of  ileocecal region with sparing of more distal ascending colon and proximal transverse colon would be unusual.  Plan:  1.  Continue antibiotics, guided by Klebsiella sensitivities. 2.  Continue hydration. 3.  Diet as tolerated. 4.  If continues to improve with antibiotics, would not do any further intervention at the present time; on the other hand, if he worsens,  would consider CT angiogram of abd/pelvis to assess mesenteric vasculature.   5.  Given my concern for proximal colonic diverticulitis, I am reluctant to pursue colonoscopy at the present time, though he will require surveillance colonoscopy in the next several months given his prior history of polyps and last colonoscopy 5 years ago. 6.  [facility] will follow.   LOS: 2 days   Kiyomi Pallo M  09/12/2019, 11:57 AM  Cell [phone] If no answer or after 5 PM call [phone]

## 2019-09-12 NOTE — Progress Notes (Signed)
Pt. Found wthout IV access and L arm infiltration. IV team ready to start new IV. Pt. States his antibiotic isnt due till in the am and he wants to wait until then to be stuck. Notified RN. Will come back when IV consult is put in tomorrow

## 2019-09-12 NOTE — Plan of Care (Signed)
  Problem: Activity: Goal: Risk for activity intolerance will decrease Outcome: Progressing   Problem: Coping: Goal: Level of anxiety will decrease Outcome: Progressing   Problem: Pain Managment: Goal: General experience of comfort will improve Outcome: Progressing   Problem: Safety: Goal: Ability to remain free from injury will improve Outcome: Progressing   

## 2019-09-12 NOTE — Progress Notes (Signed)
[facility] PROGRESS NOTE  Arthur Spencer GNF:[id number] DOB: 1946/06/29 DOA: 09/10/2019 PCP: Caffie Damme, MD  Brief summary   Arthur Spencer is a 73 y.o. male with medical history significant for type 2 diabetes mellitus, hypertension, and hyperlipidemia, now returning to the hospital for admission for positive blood culture from yesterday.  Patient had been in his usual state of health until 09/06/2019 when he developed acute onset of generalized abdominal pain, nausea, and malaise.  The abdominal pain resolved, but he continued to feel generally weak.  He denies vomiting, diarrhea, or dysuria.  He also denies flank pain.  He has a chronic cough that he attributes to sinus problems, but denies any change in that or new shortness of breath.  Patient had an office-based procedure 2 months ago, bilateral resection of inferior turbinates, and has not had any issues from that.  He was seen in the ED yesterday, cultures were drawn, he received broad-spectrum antibiotics, felt better, and was discharged with Augmentin for possible CAP.  Blood cultures grew Klebsiella, sensitivities pending, and the patient was called back and instructed to present to the hospital for direct admission.   On arrival, patient is afebrile with normal heart rate and normal blood pressure, saturating well on room air, appears fatigued, but in no distress.  Assessment/Plan:  Klebsiella bacteremia  - Patient was seen in ED on 9/26 with general malaise after having an episode of abdominal pain 3 days earlier, was febrile and leukopenic with mild lactate elevation, was cultured and treated with broad-spectrum antibiotics, discharged with Augmentin, and called back for GNR from blood culture  - source is unclear. ? GI. CT abd: Mild thickening of the terminal ileum and cecum in the right lower quadrant with stranding and thickening centered predominantly upon the draining mesenteric venous branch.  Surrounding mesenteric adenopathy is  present as well. While findings are nonspecific, overall appearance raises concern for potential veno-occlusive mesenteric ischemia versus an enterocolitis. -consulted gi evaluation. Patient reports h/o colonoscopy with polyp in 2015. Will try to obtain records from [place]. ? needs repeat colonoscopy  -received empiric meropenem (9/27-9/28). Will deescalate to iv ceftriaxone (9/29>) for 24-48 hrs inpatient. 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 Recently updated his family (indicate person spoken with, relationship, and if by phone, the number) Disposition Plan: remains inpatient    Consultants:  none  Procedures:  none  Antibiotics: Anti-infectives (From admission, onward)   Start     Dose/Rate Route Frequency Ordered Stop   09/10/19 2030  meropenem (MERREM) 1 g in sodium chloride 0.9 % 100 mL IVPB     1 g 200 mL/hr over 30 Minutes Intravenous Every 8 hours 09/10/19 1941         (indicate start date, and stop date if known)  HPI/Subjective: Reports mild intermittent right sided abdominal discomfort. No vomiting. No BM yet. + flatus.  -afebrile. Repeat blood culture: NGTD  Objective: Vitals:   09/12/19 0409 09/12/19 0803  BP: (!) 159/74 (!) 160/69  Pulse: Marland Kitchen)  56 63  Resp: 16 18  Temp: 99.4 F (37.4 C) 98.6 F (37 C)  SpO2: 99% 99%    Intake/Output Summary (Last 24 hours) at 09/12/2019 1025 Last data filed at 09/11/2019 1700 Gross per 24 hour  Intake 480 ml  Output -  Net 480 ml   Filed Weights   09/11/19  0100  Weight: 106 kg    Exam:   General:  No distress   Cardiovascular: s1,s2 rrr  Respiratory: CTA BL  Abdomen: soft, mild right sided tenderness.   Musculoskeletal: no leg edema    Data Reviewed: Basic Metabolic Panel: Recent Labs  Lab 09/09/19 1055 09/10/19 2014 09/11/19 0329  NA 136 135 137  K 3.9 3.6 3.6  CL 100 101 105  CO2 23 22 24   GLUCOSE 199* 126* 148*  BUN 17 9 8   CREATININE 0.99 0.97 0.79  CALCIUM 9.0 9.1 8.8*   Liver Function Tests: Recent Labs  Lab 09/09/19 1055 09/10/19 2014 09/11/19 0329  AST 74* 95* 83*  ALT 105* 129* 116*  ALKPHOS 90 86 77  BILITOT 0.8 0.7 1.1  PROT 6.6 6.6 5.7*  ALBUMIN 3.8 3.4* 2.9*   No results for input(s): LIPASE, AMYLASE in the last 168 hours. No results for input(s): AMMONIA in the last 168 hours. CBC: Recent Labs  Lab 09/09/19 1055 09/10/19 2014 09/11/19 0329  WBC 3.4* 6.7 5.6  NEUTROABS 3.0 4.8 3.8  HGB 12.2* 12.3* 11.4*  HCT 37.8* 36.1* 32.7*  MCV 91.5 89.6 88.9  PLT 102* 117* 120*   Cardiac Enzymes: No results for input(s): CKTOTAL, CKMB, CKMBINDEX, TROPONINI in the last 168 hours. BNP (last 3 results) No results for input(s): BNP in the last 8760 hours.  ProBNP (last 3 results) No results for input(s): PROBNP in the last 8760 hours.  CBG: Recent Labs  Lab 09/11/19 0639 09/11/19 1315 09/11/19 1638 09/11/19 2026 09/12/19 0638  GLUCAP 148* 116* 154* 106* 140*    Recent Results (from the past 240 hour(s))  Blood culture (routine x 2)     Status: Abnormal   Collection Time: 09/09/19 11:13 AM   Specimen: BLOOD RIGHT ARM  Result Value Ref Range Status   Specimen Description   Final    BLOOD RIGHT ARM Performed at [facility], [address] [place], [address] [place]    Special Requests   Final    BOTTLES DRAWN AEROBIC AND ANAEROBIC Blood Culture adequate volume Performed at [facility], [address] [place], [address] [place]    Culture  Setup Time   Final     GRAM NEGATIVE RODS IN BOTH AEROBIC AND ANAEROBIC BOTTLES CRITICAL VALUE NOTED.  VALUE IS CONSISTENT WITH PREVIOUSLY REPORTED AND CALLED VALUE.    Culture (A)  Final    KLEBSIELLA PNEUMONIAE SUSCEPTIBILITIES PERFORMED ON PREVIOUS CULTURE WITHIN THE LAST 5 DAYS. Performed at [facility], [address], [place], [address] [place]    Report Status 09/12/2019 FINAL  Final  Blood culture (routine x 2)     Status: Abnormal   Collection Time: 09/09/19 11:13 AM   Specimen: BLOOD LEFT ARM  Result Value Ref Range Status   Specimen Description   Final    BLOOD LEFT ARM Performed at [facility], [address] [place], [address] [place]    Special Requests   Final    BOTTLES DRAWN AEROBIC AND ANAEROBIC Blood Culture adequate volume Performed at [facility], [address] [place], [place] [address]    Culture  Setup Time   Final    GRAM NEGATIVE RODS IN BOTH AEROBIC AND ANAEROBIC BOTTLES CRITICAL RESULT CALLED TO, READ BACK BY AND VERIFIED WITH: RN AT [facility] Josph Macho [id number] AT 1122 BY CM Performed at [facility], [place] [address], [place], [place] [address]    Culture KLEBSIELLA PNEUMONIAE (A)  Final   Report Status 09/12/2019 FINAL  Final   Organism ID, Bacteria KLEBSIELLA PNEUMONIAE  Final      Susceptibility   Klebsiella pneumoniae - MIC*    AMPICILLIN >=32 RESISTANT Resistant     CEFAZOLIN <=4 SENSITIVE Sensitive     CEFEPIME <=1 SENSITIVE Sensitive     CEFTAZIDIME <=1 SENSITIVE Sensitive     CEFTRIAXONE <=1 SENSITIVE Sensitive     CIPROFLOXACIN <=0.25 SENSITIVE Sensitive     GENTAMICIN <=1 SENSITIVE Sensitive     IMIPENEM <=0.25 SENSITIVE Sensitive     TRIMETH/SULFA <=20 SENSITIVE Sensitive     AMPICILLIN/SULBACTAM 4 SENSITIVE Sensitive     PIP/TAZO 8 SENSITIVE Sensitive     * KLEBSIELLA PNEUMONIAE  SARS Coronavirus 2 [facility] order, Performed in [place] hospital lab) Nasopharyngeal Nasopharyngeal Swab     Status: None   Collection  Time: 09/09/19 11:13 AM   Specimen: Nasopharyngeal Swab  Result Value Ref Range Status   SARS Coronavirus 2 NEGATIVE NEGATIVE Final    Comment: (NOTE) If result is NEGATIVE SARS-CoV-2 target nucleic acids are NOT DETECTED. The SARS-CoV-2 RNA is generally detectable in upper and lower  respiratory specimens during the acute phase of infection. The lowest  concentration of SARS-CoV-2 viral copies this assay can detect is 250  copies / mL. A negative result does not preclude SARS-CoV-2 infection  and should not be used as the sole basis for treatment or other  patient management decisions.  A negative result may occur with  improper specimen collection / handling, submission of specimen other  than nasopharyngeal swab, presence of viral mutation(s) within the  areas targeted by this assay, and inadequate number of viral copies  (<250 copies / mL). A negative result must be combined with clinical  observations, patient history, and epidemiological information. If result is POSITIVE SARS-CoV-2 target nucleic acids are DETECTED. The SARS-CoV-2 RNA is generally detectable in upper and lower  respiratory specimens dur ing the acute phase of infection.  Positive  results are indicative of active infection with SARS-CoV-2.  Clinical  correlation with patient history and other diagnostic information is  necessary to determine patient infection status.  Positive results do  not rule out bacterial infection or co-infection with other viruses. If result is PRESUMPTIVE POSTIVE SARS-CoV-2 nucleic acids MAY BE PRESENT.   A presumptive positive result was obtained on the submitted specimen  and confirmed on repeat testing.  While 2019 novel coronavirus  (SARS-CoV-2) nucleic acids may be present in the submitted sample  additional confirmatory testing may be necessary for epidemiological  and / or clinical management purposes  to differentiate between  SARS-CoV-2 and other Sarbecovirus currently known  to infect humans.  If clinically indicated additional testing with an alternate test  methodology [phone]) is advised. The SARS-CoV-2 RNA is generally  detectable in upper and lower respiratory sp ecimens during the acute  phase of infection. The expected result is Negative. Fact Sheet for Patients:  StrictlyIdeas.no Fact Sheet for Healthcare Providers: BankingDealers.co.za This test is not yet approved or cleared by the [place] FDA and has been  authorized for detection and/or diagnosis of SARS-CoV-2 by FDA under an Emergency Use Authorization (EUA).  This EUA will remain in effect (meaning this test can be used) for the duration of the COVID-19 declaration under Section 564(b)(1) of the Act, 21 U.S.C. section 360bbb-3(b)(1), unless the authorization is terminated or revoked sooner. Performed at [facility] [place], [address] [place], [place] [address]   Blood Culture ID Panel (Reflexed)     Status: Abnormal   Collection Time: 09/09/19 11:13 AM  Result Value Ref Range Status   Enterococcus species NOT DETECTED NOT DETECTED Final   Listeria monocytogenes NOT DETECTED NOT DETECTED Final   Staphylococcus species NOT DETECTED NOT DETECTED Final   Staphylococcus aureus (BCID) NOT DETECTED NOT DETECTED Final   Streptococcus species NOT DETECTED NOT DETECTED Final   Streptococcus agalactiae NOT DETECTED NOT DETECTED Final   Streptococcus pneumoniae NOT DETECTED NOT DETECTED Final   Streptococcus pyogenes NOT DETECTED NOT DETECTED Final   Acinetobacter baumannii NOT DETECTED NOT DETECTED Final   Enterobacteriaceae species DETECTED (A) NOT DETECTED Final    Comment: Enterobacteriaceae represent a large family of gram-negative bacteria, not a single organism. CRITICAL RESULT CALLED TO, READ BACK BY AND VERIFIED WITH: RN AT [facility] S GOUGE [phone] AT 1122 BY CM    Enterobacter cloacae complex NOT DETECTED NOT DETECTED Final    Escherichia coli NOT DETECTED NOT DETECTED Final   Klebsiella oxytoca NOT DETECTED NOT DETECTED Final   Klebsiella pneumoniae DETECTED (A) NOT DETECTED Final    Comment: CRITICAL RESULT CALLED TO, READ BACK BY AND VERIFIED WITH: RN AT [facility] S [facility] [phone] AT 1122 BY CM    Proteus species NOT DETECTED NOT DETECTED Final   Serratia marcescens NOT DETECTED NOT DETECTED Final   Carbapenem resistance NOT DETECTED NOT DETECTED Final   Haemophilus influenzae NOT DETECTED NOT DETECTED Final   Neisseria meningitidis NOT DETECTED NOT DETECTED Final   Pseudomonas aeruginosa NOT DETECTED NOT DETECTED Final   Candida albicans NOT DETECTED NOT DETECTED Final   Candida glabrata NOT DETECTED NOT DETECTED Final   Candida krusei NOT DETECTED NOT DETECTED Final   Candida parapsilosis NOT DETECTED NOT DETECTED Final   Candida tropicalis NOT DETECTED NOT DETECTED Final    Comment: Performed at [facility], [address] [place], [place] [address]  Urine culture     Status: Abnormal   Collection Time: 09/09/19 12:52 PM   Specimen: Urine, Clean Catch  Result Value Ref Range Status   Specimen Description   Final    URINE, CLEAN CATCH Performed at [facility] [place], [address] [place], [place] [address]    Special Requests   Final    NONE Performed at [facility] [place], [address] [place], [place] [address]    Culture (A)  Final    <10,000 COLONIES/mL INSIGNIFICANT GROWTH Performed at [facility], [address] [place], [place] [address]    Report Status 09/10/2019 FINAL  Final  SARS Coronavirus 2 [facility](Hospital order, Performed in [facility]) Nasopharyngeal Nasopharyngeal Swab     Status: None   Collection Time: 09/10/19  5:12 AM   Specimen: Nasopharyngeal Swab  Result Value Ref Range Status   SARS Coronavirus 2 NEGATIVE NEGATIVE Final    Comment: (NOTE) If result is NEGATIVE SARS-CoV-2 target nucleic acids are NOT DETECTED. The SARS-CoV-2 RNA is  generally detectable in upper and lower  respiratory specimens during the acute phase of  infection. The lowest  concentration of SARS-CoV-2 viral copies this assay can detect is 250  copies / mL. A negative result does not preclude SARS-CoV-2 infection  and should not be used as the sole basis for treatment or other  patient management decisions.  A negative result may occur with  improper specimen collection / handling, submission of specimen other  than nasopharyngeal swab, presence of viral mutation(s) within the  areas targeted by this assay, and inadequate number of viral copies  (<250 copies / mL). A negative result must be combined with clinical  observations, patient history, and epidemiological information. If result is POSITIVE SARS-CoV-2 target nucleic acids are DETECTED. The SARS-CoV-2 RNA is generally detectable in upper and lower  respiratory specimens dur ing the acute phase of infection.  Positive  results are indicative of active infection with SARS-CoV-2.  Clinical  correlation with patient history and other diagnostic information is  necessary to determine patient infection status.  Positive results do  not rule out bacterial infection or co-infection with other viruses. If result is PRESUMPTIVE POSTIVE SARS-CoV-2 nucleic acids MAY BE PRESENT.   A presumptive positive result was obtained on the submitted specimen  and confirmed on repeat testing.  While 2019 novel coronavirus  (SARS-CoV-2) nucleic acids may be present in the submitted sample  additional confirmatory testing may be necessary for epidemiological  and / or clinical management purposes  to differentiate between  SARS-CoV-2 and other Sarbecovirus currently known to infect humans.  If clinically indicated additional testing with an alternate test  methodology [phone]) is advised. The SARS-CoV-2 RNA is generally  detectable in upper and lower respiratory sp ecimens during the acute  phase of  infection. The expected result is Negative. Fact Sheet for Patients:  BoilerBrush.com.cy Fact Sheet for Healthcare Providers: https://pope.com/ This test is not yet approved or cleared by the [place] FDA and has been authorized for detection and/or diagnosis of SARS-CoV-2 by FDA under an Emergency Use Authorization (EUA).  This EUA will remain in effect (meaning this test can be used) for the duration of the COVID-19 declaration under Section 564(b)(1) of the Act, 21 U.S.C. section 360bbb-3(b)(1), unless the authorization is terminated or revoked sooner. Performed at [facility], [address] [place], [place] [address]   Culture, blood (routine x 2)     Status: None (Preliminary result)   Collection Time: 09/10/19  8:06 PM   Specimen: BLOOD  Result Value Ref Range Status   Specimen Description BLOOD RIGHT ANTECUBITAL  Final   Special Requests   Final    BOTTLES DRAWN AEROBIC AND ANAEROBIC Blood Culture adequate volume   Culture   Final    NO GROWTH 2 DAYS Performed at [facility], [address] [place], [place] [address]    Report Status PENDING  Incomplete  Culture, blood (routine x 2)     Status: None (Preliminary result)   Collection Time: 09/10/19  8:14 PM   Specimen: BLOOD  Result Value Ref Range Status   Specimen Description BLOOD LEFT ANTECUBITAL  Final   Special Requests   Final    BOTTLES DRAWN AEROBIC ONLY Blood Culture adequate volume   Culture   Final    NO GROWTH 2 DAYS Performed at [facility], [address], [place], [place] [address]    Report Status PENDING  Incomplete     Studies: Ct Abdomen Pelvis W Contrast  Addendum Date: 09/11/2019   ADDENDUM REPORT: 09/11/2019 22:04 ADDENDUM:  These results were called by telephone on 09/11/2019 at 10:04 pm to provider Tama Gander , who verbally acknowledged these results. Electronically Signed   By: Kreg Shropshire M.D.   On:  09/11/2019 22:04   Result Date: 09/11/2019 CLINICAL DATA:  Generalized abdominal pain, nausea and malaise EXAM: CT ABDOMEN AND PELVIS WITH CONTRAST TECHNIQUE: Multidetector CT imaging of the abdomen and pelvis was performed using the standard protocol following bolus administration of intravenous contrast. CONTRAST:  OMNIPAQUE IOHEXOL 300 MG/ML  SOLN COMPARISON:  CT 12/05/2018 FINDINGS: Lower chest: Bandlike areas of scarring and/or atelectasis in the lung bases. Normal heart size. No pericardial effusion. Hepatobiliary: No focal liver abnormality is seen. No gallstones, gallbladder wall thickening, or biliary dilatation. Pancreas: Unremarkable. No pancreatic ductal dilatation or surrounding inflammatory changes. Spleen: Normal in size without focal abnormality. Adrenals/Urinary Tract: There is calcification involving the medial limb of the right adrenal gland which could reflect prior adrenal hemorrhage or sequela of remote infection. The body of the right adrenal gland and the left adrenal gland have a more normal appearance. Few areas of cortical scarring involving the left kidney are similar to prior as is mild symmetric perinephric stranding. Kidneys enhance symmetrically. No concerning renal lesions or urolithiasis. No hydronephrosis. The bladder is circumferentially thickened with perivesicular hazy stranding. Stomach/Bowel: Distal esophagus, stomach and duodenal sweep are unremarkable. Proximal small bowel is free of abnormality. There is mild thickening of the terminal ileum and cecum in the right lower quadrant but with notable stranding and inflammation centered predominantly on a draining, slightly dilated mesenteric venous branch, which can be followed proximally to the superior mesenteric vein. A normal appendix is visualized. Scattered colonic diverticula without focal pericolonic inflammation to suggest diverticulitis. No other colonic dilatation or wall thickening. Vascular/Lymphatic:  Stranding and thickening appears centered predominantly upon a at somewhat dilated mesenteric venous branch which courses to the inflamed loops of small bowel and cecum in the right lower quadrant. Surrounding mesenteric adenopathy is present as well. Atherosclerotic plaque within the normal caliber aorta. No pathologically enlarged lymph nodes. Reproductive: The prostate and seminal vesicles are unremarkable. Other: Trace reactive free fluid noted in the right pericolic gutter tracking to the right pelvis. No free intraperitoneal air. No organized collection or abscess. No bowel containing hernia. Small fat containing umbilical hernia and bilateral fat containing inguinal hernias. 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 IMPRESSION: 1. Mild thickening of the terminal ileum and cecum in the right lower quadrant with stranding and thickening centered predominantly upon the draining mesenteric venous branch. Surrounding mesenteric adenopathy is present as well. While findings are nonspecific, overall appearance raises concern for potential veno-occlusive mesenteric ischemia versus an enterocolitis. 2. Circumferentially thickened bladder with perivesicular hazy stranding, suggestive of cystitis. Correlate with urinalysis. 3. Aortic Atherosclerosis (ICD10-I70.0). Electronically Signed: By: Kreg Shropshire M.D. On: 09/11/2019 21:47   Ct Maxillofacial Ltd Wo Cm  Result Date: 09/11/2019 CLINICAL DATA:  Sinusitis, malaise EXAM: CT PARANASAL SINUS LIMITED WITHOUT CONTRAST TECHNIQUE: Multidetector CT images of the paranasal sinuses were obtained using the standard protocol without intravenous contrast. COMPARISON:  None. FINDINGS: Paranasal sinuses: Frontal: Normally aerated. Patent frontal sinus drainage pathways. Ethmoid: Normally aerated. Maxillary: Normally aerated. Sphenoid: Normally aerated.  Patent sphenoethmoidal recesses. Sphenoid sinus septum inserts eccentric Lea into the left lateral wall of the sphenoid sinus near the carotid canal. Right ostiomeatal unit: Patent. Left ostiomeatal unit: Patent. Nasal passages: Patent. Asymmetric mucosal fullness the  left turbinates likely related to the normal nasal cycle.Leftward nasal septal deviation with a contacting nasal septal spur. Anatomy: No pneumatization superior to anterior ethmoid notches. Symmetric and intact olfactory grooves and fovea ethmoidalis, Keros I (1-74mm). Sellar sphenoid pneumatization pattern. No dehiscence of carotid or optic canals. No onodi cell. Other: Orbits and intracranial compartment are unremarkable. Visible mastoid air cells are normally aerated. There is extensive pneumatization of the petrous apices. Middle ear cavities are clear. There is debris within both external auditory canals. IMPRESSION: Normally aerated paranasal sinuses. Patent sinus drainage pathways. Debris in the external auditory canals, likely cerumen impaction. Leftward nasal septal deviation with a contacting left nasal septal spur. Pneumatization of the petrous apices. Electronically Signed   By: Kreg Shropshire M.D.   On: 09/11/2019 21:55    Scheduled Meds: . atorvastatin  40 mg Oral QHS  . insulin aspart  0-5 Units Subcutaneous QHS  . insulin aspart  0-9 Units Subcutaneous TID WC   Continuous Infusions: . sodium chloride    . meropenem (MERREM) IV 1 g (09/12/19 0603)    Principal Problem:   Bacteremia due to Gram-negative bacteria Active Problems:   DM (diabetes mellitus), type 2 (HCC)   Lymphopenia   Thrombocytopenia (HCC)    Time spent: >25 minutes     Esperanza Sheets  [facility] Pager [phone]. If 7PM-7AM, please contact night-coverage at [email or web address], password [facility] 09/12/2019, 10:25 AM  LOS: 2 days

## 2019-09-12 NOTE — Progress Notes (Signed)
Patient refusing IV at this time, states will allow staff to put IV in tomorrow when IV antibiotic is scheduled.

## 2019-09-13 LAB — COMPREHENSIVE METABOLIC PANEL
ALT: 120 U/L — ABNORMAL HIGH (ref 0–44)
AST: 59 U/L — ABNORMAL HIGH (ref 15–41)
Albumin: 2.9 g/dL — ABNORMAL LOW (ref 3.5–5.0)
Alkaline Phosphatase: 99 U/L (ref 38–126)
Anion gap: 8 (ref 5–15)
BUN: 9 mg/dL (ref 8–23)
CO2: 26 mmol/L (ref 22–32)
Calcium: 9.1 mg/dL (ref 8.9–10.3)
Chloride: 103 mmol/L (ref 98–111)
Creatinine, Ser: 0.79 mg/dL (ref 0.61–1.24)
GFR calc Af Amer: >60 mL/min (ref >60)
GFR calc non Af Amer: >60 mL/min (ref >60)
Glucose, Bld: 144 mg/dL — ABNORMAL HIGH (ref 70–99)
Potassium: 3.7 mmol/L (ref 3.5–5.1)
Sodium: 137 mmol/L (ref 135–145)
Total Bilirubin: 0.6 mg/dL (ref 0.3–1.2)
Total Protein: 6.3 g/dL — ABNORMAL LOW (ref 6.5–8.1)

## 2019-09-13 LAB — GLUCOSE, CAPILLARY
Glucose-Capillary: 138 mg/dL — ABNORMAL HIGH (ref 70–99)
Glucose-Capillary: 174 mg/dL — ABNORMAL HIGH (ref 70–99)
Glucose-Capillary: 183 mg/dL — ABNORMAL HIGH (ref 70–99)
Glucose-Capillary: 155 mg/dL — ABNORMAL HIGH (ref 70–99)

## 2019-09-13 LAB — CBC
HCT: 33.7 % — ABNORMAL LOW (ref 39.0–52.0)
Hemoglobin: 11.7 g/dL — ABNORMAL LOW (ref 13.0–17.0)
MCH: 30.3 pg (ref 26.0–34.0)
MCHC: 34.7 g/dL (ref 30.0–36.0)
MCV: 87.3 fL (ref 80.0–100.0)
Platelets: 160 10*3/uL (ref 150–400)
RBC: 3.86 MIL/uL — ABNORMAL LOW (ref 4.22–5.81)
RDW: 14.1 % (ref 11.5–15.5)
WBC: 5.9 10*3/uL (ref 4.0–10.5)
nRBC: 0 % (ref 0.0–0.2)

## 2019-09-13 MED ORDER — SODIUM CHLORIDE 0.9 % IV SOLN
INTRAVENOUS | Status: AC
  Administered 2019-09-13: 17:00:00 via INTRAVENOUS

## 2019-09-13 MED ORDER — AMOXICILLIN-POT CLAVULANATE 875-125 MG PO TABS: 1.0000 | ORAL_TABLET | Freq: Two times a day (BID) | ORAL | 0 refills | Status: AC

## 2019-09-13 MED ORDER — POLYETHYLENE GLYCOL 3350 17 G PO PACK
17.0000 g | PACK | Freq: Two times a day (BID) | ORAL | Status: DC
  Administered 2019-09-13 (×2): 17 g via ORAL
  Filled 2019-09-13 (×3): qty 1

## 2019-09-13 MED ORDER — DOCUSATE SODIUM 100 MG PO CAPS: 100.0000 mg | ORAL_CAPSULE | Freq: Two times a day (BID) | ORAL | 0 refills | Status: AC | PRN

## 2019-09-13 MED ORDER — MAGNESIUM CITRATE PO SOLN: 1.0000 | Freq: Once | ORAL | Status: DC | PRN

## 2019-09-13 MED ORDER — BISACODYL 10 MG RE SUPP
10.0000 mg | Freq: Once | RECTAL | Status: DC
  Filled 2019-09-13: qty 1

## 2019-09-13 NOTE — Progress Notes (Signed)
PROGRESS NOTE    Arthur Spencer  NGE:[id number] DOB: [birth year] DOA: 09/10/2019 PCP: Caffie Damme, MD   Brief Narrative:  73 y.o.malewith medical history significant fortype 2 diabetes mellitus, hypertension, and hyperlipidemia, now returning to the hospital for admission for positive blood culture from yesterday.Patient had been in his usual state of health until 09/06/2019 when he developed acute onset of generalized abdominal pain, nausea, and malaise. The abdominal pain resolved, but he continued to feel generally weak.He denies vomiting, diarrhea, or dysuria. He also denies flank pain. He has a chronic cough that he attributes to sinus problems, but denies any change in that or new shortness of breath. Patient had an office-based procedure 2 months ago, bilateral resection of inferior turbinates, and has not had any issues from that. He was seen in the ED yesterday, cultures were drawn, he received broad-spectrum antibiotics, felt better, and was discharged with Augmentin for possible CAP. Blood cultures grew Klebsiella, sensitivities pending, and the patient was called back and instructed to present to the hospital for direct admission.   On arrival, patient is afebrile with normal heart rate and normal blood pressure, saturating well on room air, appears fatigued, but in no distress. Patient was admitted,treated with ceftriaxone for Klebsiella bacteremia. CT abdomen that shows mild thickening of the terminal ileum and cecum in the right lower quadrant with stranding and thickening concerning for potential venoocclusive mesenteric ischemia versus enterocolitis.  Subjective: Seen this morning.  Overall feeling better complains of some abdominal discomfort queasiness, not having bowel movement. No nausea vomiting fever chills  Assessment & Plan:   Principal Problem:   Bacteremia due to Gram-negative bacteria Active Problems:   DM (diabetes mellitus), type 2 (HCC)   Lymphopenia    Thrombocytopenia (HCC)  Assessment/Plan:  Klebsiella bacteremia with lower abdominal pain and fever chills: source unclear likely GI source.  Appreciate GI input continue ceftriaxone.  Repeat blood culture negative so far from  9/27.  Patient is afebrile, wbc stable at 5.9K.  Initially on meropenem 09/02/2016 928- switched to ceftriaxone-once abdominal pain stable-we will discharge him on Augmentin. (already sent Augmentin 875/125 twice daily x10 days to his pharmacy).   Abnormal CT scan with inflammatory changes in the right lower quadrant: Significant improvement with antibiotics IV fluids.  Appreciate GI input suspecting cecal/ascending colon diverticulitis.  Seen by GI symptoms not typical for mesenteric ischemia but isolated involvement of ileocecal region with sparing of more distal ascending colon and proximal transverse colon would be unusual.  At this time recommending antibiotics.  Patient complains of some abdominal discomfort today we will try laxative with MiraLAX and Dulcolax he feels better after having bowel movement can go home if not might consider CT angiogram abdomen pelvis per Dr. Dulce Sellar and discussed w him.  Followed up in this evening he feels some better after a bowel movement but is going to have more bowel movements and and would like to see how he does.  Would like to stay one more day.  If he is still has ongoing complaint w and obtaining CT Angio Abdomen pelvis tomorrow, discussed with Dr. Dulce Sellar again. Add magcitrate prn,continue Dulcolax and MiraLAX twice daily  Lymphopenia; thrombocytopenia:WBC was 3,400 in ED with absolute lymphocytes 170,Platelet count 102k in ED, previously normal.suspect due to #1.  Cbc improved after antibiotics  Type II DM, hba1c 6.8, sugar controlled, on ssi here. Resume metformin upon discharge.  Hypertension: stable. Hold lisinopril, hold HCTZ due to contrast study.  Body mass index is 30.83 kg/m.  DVT prophylaxis: SCD Code  Status:Full Family Communication: plan of care discussed with patient in detail.updated family. Disposition Plan: Remains inpatient pending clinical improvement in his abdominal discomfort,constipation.   Consultants:  GI  Procedures: None  Microbiology: Klebsiella in blood culture form 9/26 Blood culture 9/28 no growth so far  Antimicrobials: Anti-infectives (From admission, onward)   Start     Dose/Rate Route Frequency Ordered Stop   09/13/19 0000  amoxicillin-clavulanate (AUGMENTIN) 875-125 MG tablet     1 tablet Oral Every 12 hours 09/13/19 0941 09/23/19 2359   09/12/19 1100  cefTRIAXone (ROCEPHIN) 2 g in sodium chloride 0.9 % 100 mL IVPB     2 g 200 mL/hr over 30 Minutes Intravenous Every 24 hours 09/12/19 1032     09/10/19 2030  meropenem (MERREM) 1 g in sodium chloride 0.9 % 100 mL IVPB  Status:  Discontinued     1 g 200 mL/hr over 30 Minutes Intravenous Every 8 hours 09/10/19 1941 09/12/19 1032       Objective: Vitals:   09/12/19 2241 09/13/19 0446 09/13/19 0756 09/13/19 1416  BP:  (!) 145/65 (!) 159/71 136/73  Pulse:   61 (!) 57  Resp:  18 16 16   Temp: 98.7 F (37.1 C) 98.3 F (36.8 C) 98.8 F (37.1 C) 99.4 F (37.4 C)  TempSrc: Oral Oral Oral Oral  SpO2:  97% 98% 98%  Weight:      Height:        Intake/Output Summary (Last 24 hours) at 09/13/2019 1508 Last data filed at 09/13/2019 1400 Gross per 24 hour  Intake 1165.24 ml  Output 1 ml  Net 1164.24 ml   Filed Weights   09/11/19 0100  Weight: 106 kg   Weight change:   Body mass index is 30.83 kg/m.  Intake/Output from previous day: 09/29 0701 - 09/30 0700 In: 1110 [P.O.:960; I.V.:50; IV Piggyback:100] Out: -  Intake/Output this shift: Total I/O In: 585.2 [P.O.:480; I.V.:5.2; IV Piggyback:100] Out: 1 [Stool:1]  Examination:  General exam: Appears calm and comfortable,Not in distress, older fore the age HEENT:PERRL,Oral mucosa moist, Ear/Nose normal on gross exam Respiratory system:  Bilateral equal air entry, normal vesicular breath sounds, no wheezes or crackles  Cardiovascular system: S1 & S2 heard,No JVD, murmurs. Gastrointestinal system: Abdomen is  Soft, mildly tender rt lower abdomen, midly distended vs obses, BS +  Nervous System:Alert and oriented. No focal neurological deficits/moving extremities, sensation intact. Extremities: No edema, no clubbing, distal peripheral pulses palpable. Skin: No rashes, lesions, no icterus MSK: Normal muscle bulk,tone ,power  Medications:  Scheduled Meds: . atorvastatin  40 mg Oral QHS  . bisacodyl  10 mg Rectal Once  . insulin aspart  0-5 Units Subcutaneous QHS  . insulin aspart  0-9 Units Subcutaneous TID WC  . polyethylene glycol  17 g Oral BID   Continuous Infusions: . sodium chloride Stopped (09/13/19 1144)  . cefTRIAXone (ROCEPHIN)  IV Stopped (09/13/19 1116)    Data Reviewed: I have personally reviewed following labs and imaging studies  CBC: Recent Labs  Lab 09/09/19 1055 09/10/19 2014 09/11/19 0329 09/13/19 0226  WBC 3.4* 6.7 5.6 5.9  NEUTROABS 3.0 4.8 3.8  --   HGB 12.2* 12.3* 11.4* 11.7*  HCT 37.8* 36.1* 32.7* 33.7*  MCV 91.5 89.6 88.9 87.3  PLT 102* 117* 120* 096   Basic Metabolic Panel: Recent Labs  Lab 09/09/19 1055 09/10/19 2014 09/11/19 0329 09/13/19 0226  NA 136 135 137 137  K 3.9 3.6 3.6 3.7  CL  100 101 105 103  CO2 23 22 24 26   GLUCOSE 199* 126* 148* 144*  BUN 17 9 8 9   CREATININE 0.99 0.97 0.79 0.79  CALCIUM 9.0 9.1 8.8* 9.1   GFR: Estimated Creatinine Clearance: 105 mL/min (by C-G formula based on SCr of 0.79 mg/dL). Liver Function Tests: Recent Labs  Lab 09/09/19 1055 09/10/19 2014 09/11/19 0329 09/13/19 0226  AST 74* 95* 83* 59*  ALT 105* 129* 116* 120*  ALKPHOS 90 86 77 99  BILITOT 0.8 0.7 1.1 0.6  PROT 6.6 6.6 5.7* 6.3*  ALBUMIN 3.8 3.4* 2.9* 2.9*   No results for input(s): LIPASE, AMYLASE in the last 168 hours. No results for input(s): AMMONIA in the last 168  hours. Coagulation Profile: No results for input(s): INR, PROTIME in the last 168 hours. Cardiac Enzymes: No results for input(s): CKTOTAL, CKMB, CKMBINDEX, TROPONINI in the last 168 hours. BNP (last 3 results) No results for input(s): PROBNP in the last 8760 hours. HbA1C: Recent Labs    09/10/19 2014  HGBA1C 6.8*   CBG: Recent Labs  Lab 09/12/19 1201 09/12/19 1621 09/12/19 2226 09/13/19 0640 09/13/19 1131  GLUCAP 157* 173* 237* 138* 174*   Lipid Profile: No results for input(s): CHOL, HDL, LDLCALC, TRIG, CHOLHDL, LDLDIRECT in the last 72 hours. Thyroid Function Tests: No results for input(s): TSH, T4TOTAL, FREET4, T3FREE, THYROIDAB in the last 72 hours. Anemia Panel: No results for input(s): VITAMINB12, FOLATE, FERRITIN, TIBC, IRON, RETICCTPCT in the last 72 hours. Sepsis Labs: Recent Labs  Lab 09/09/19 1055 09/09/19 1310  LATICACIDVEN 2.5* 1.3    Recent Results (from the past 240 hour(s))  Blood culture (routine x 2)     Status: Abnormal   Collection Time: 09/09/19 11:13 AM   Specimen: BLOOD RIGHT ARM  Result Value Ref Range Status   Specimen Description   Final    BLOOD RIGHT ARM Performed at [facility], [address] [place], [address] [place]    Special Requests   Final    BOTTLES DRAWN AEROBIC AND ANAEROBIC Blood Culture adequate volume Performed at [facility], [address] [place], [address] [place]    Culture  Setup Time   Final    GRAM NEGATIVE RODS IN BOTH AEROBIC AND ANAEROBIC BOTTLES CRITICAL VALUE NOTED.  VALUE IS CONSISTENT WITH PREVIOUSLY REPORTED AND CALLED VALUE.    Culture (A)  Final    KLEBSIELLA PNEUMONIAE SUSCEPTIBILITIES PERFORMED ON PREVIOUS CULTURE WITHIN THE LAST 5 DAYS. Performed at [facility], [address], [place], [address] [place]    Report Status 09/12/2019 FINAL  Final  Blood culture (routine x 2)     Status: Abnormal   Collection Time: 09/09/19 11:13 AM   Specimen: BLOOD LEFT ARM   Result Value Ref Range Status   Specimen Description   Final    BLOOD LEFT ARM Performed at [facility], [address] [place], [facility] [place]    Special Requests   Final    BOTTLES DRAWN AEROBIC AND ANAEROBIC Blood Culture adequate volume Performed at [facility], [address] [place], [address] [place]    Culture  Setup Time   Final    GRAM NEGATIVE RODS IN BOTH AEROBIC AND ANAEROBIC BOTTLES CRITICAL RESULT CALLED TO, READ BACK BY AND VERIFIED WITH: RN AT [facility] [place] AT 1122 BY CM Performed at [facility], [address] [place], [address] [place]  Culture KLEBSIELLA PNEUMONIAE (A)  Final   Report Status 09/12/2019 FINAL  Final   Organism ID, Bacteria KLEBSIELLA PNEUMONIAE  Final      Susceptibility   Klebsiella pneumoniae - MIC*    AMPICILLIN >=32 RESISTANT Resistant     CEFAZOLIN <=4 SENSITIVE Sensitive     CEFEPIME <=1 SENSITIVE Sensitive     CEFTAZIDIME <=1 SENSITIVE Sensitive     CEFTRIAXONE <=1 SENSITIVE Sensitive     CIPROFLOXACIN <=0.25 SENSITIVE Sensitive     GENTAMICIN <=1 SENSITIVE Sensitive     IMIPENEM <=0.25 SENSITIVE Sensitive     TRIMETH/SULFA <=20 SENSITIVE Sensitive     AMPICILLIN/SULBACTAM 4 SENSITIVE Sensitive     PIP/TAZO 8 SENSITIVE Sensitive     * KLEBSIELLA PNEUMONIAE  SARS Coronavirus 2 [facility](Hospital order, Performed in [facility]) Nasopharyngeal Nasopharyngeal Swab     Status: None   Collection Time: 09/09/19 11:13 AM   Specimen: Nasopharyngeal Swab  Result Value Ref Range Status   SARS Coronavirus 2 NEGATIVE NEGATIVE Final    Comment: (NOTE) If result is NEGATIVE SARS-CoV-2 target nucleic acids are NOT DETECTED. The SARS-CoV-2 RNA is generally detectable in upper and lower  respiratory specimens during the acute phase of infection. The lowest  concentration of SARS-CoV-2 viral copies this assay can detect is 250  copies / mL. A negative result does not preclude SARS-CoV-2 infection   and should not be used as the sole basis for treatment or other  patient management decisions.  A negative result may occur with  improper specimen collection / handling, submission of specimen other  than nasopharyngeal swab, presence of viral mutation(s) within the  areas targeted by this assay, and inadequate number of viral copies  (<250 copies / mL). A negative result must be combined with clinical  observations, patient history, and epidemiological information. If result is POSITIVE SARS-CoV-2 target nucleic acids are DETECTED. The SARS-CoV-2 RNA is generally detectable in upper and lower  respiratory specimens dur ing the acute phase of infection.  Positive  results are indicative of active infection with SARS-CoV-2.  Clinical  correlation with patient history and other diagnostic information is  necessary to determine patient infection status.  Positive results do  not rule out bacterial infection or co-infection with other viruses. If result is PRESUMPTIVE POSTIVE SARS-CoV-2 nucleic acids MAY BE PRESENT.   A presumptive positive result was obtained on the submitted specimen  and confirmed on repeat testing.  While 2019 novel coronavirus  (SARS-CoV-2) nucleic acids may be present in the submitted sample  additional confirmatory testing may be necessary for epidemiological  and / or clinical management purposes  to differentiate between  SARS-CoV-2 and other Sarbecovirus currently known to infect humans.  If clinically indicated additional testing with an alternate test  methodology [phone]) is advised. The SARS-CoV-2 RNA is generally  detectable in upper and lower respiratory sp ecimens during the acute  phase of infection. The expected result is Negative. Fact Sheet for Patients:  BoilerBrush.com.cyhttps://www.fda.gov/media/136312/download Fact Sheet for Healthcare Providers: https://pope.com/https://www.fda.gov/media/136313/download This test is not yet approved or cleared by the [place] FDA and has  been authorized for detection and/or diagnosis of SARS-CoV-2 by FDA under an Emergency Use Authorization (EUA).  This EUA will remain in effect (meaning this test can be used) for the duration of the COVID-19 declaration under Section 564(b)(1) of the Act, 21 U.S.C. section 360bbb-3(b)(1), unless the authorization is terminated or revoked sooner. Performed at [facility] [place], [address] [place],  [place] [address]   Blood Culture ID Panel (Reflexed)     Status: Abnormal   Collection Time: 09/09/19 11:13 AM  Result Value Ref Range Status   Enterococcus species NOT DETECTED NOT DETECTED Final   Listeria monocytogenes NOT DETECTED NOT DETECTED Final   Staphylococcus species NOT DETECTED NOT DETECTED Final   Staphylococcus aureus (BCID) NOT DETECTED NOT DETECTED Final   Streptococcus species NOT DETECTED NOT DETECTED Final   Streptococcus agalactiae NOT DETECTED NOT DETECTED Final   Streptococcus pneumoniae NOT DETECTED NOT DETECTED Final   Streptococcus pyogenes NOT DETECTED NOT DETECTED Final   Acinetobacter baumannii NOT DETECTED NOT DETECTED Final   Enterobacteriaceae species DETECTED (A) NOT DETECTED Final    Comment: Enterobacteriaceae represent a large family of gram-negative bacteria, not a single organism. CRITICAL RESULT CALLED TO, READ BACK BY AND VERIFIED WITH: RN AT [facility] [id number] AT 1122 BY CM    Enterobacter cloacae complex NOT DETECTED NOT DETECTED Final   Escherichia coli NOT DETECTED NOT DETECTED Final   Klebsiella oxytoca NOT DETECTED NOT DETECTED Final   Klebsiella pneumoniae DETECTED (A) NOT DETECTED Final    Comment: CRITICAL RESULT CALLED TO, READ BACK BY AND VERIFIED WITH: RN AT [facility] [id number] AT 1122 BY CM    Proteus species NOT DETECTED NOT DETECTED Final   Serratia marcescens NOT DETECTED NOT DETECTED Final   Carbapenem resistance NOT DETECTED NOT DETECTED Final   Haemophilus influenzae NOT DETECTED NOT DETECTED Final   Neisseria  meningitidis NOT DETECTED NOT DETECTED Final   Pseudomonas aeruginosa NOT DETECTED NOT DETECTED Final   Candida albicans NOT DETECTED NOT DETECTED Final   Candida glabrata NOT DETECTED NOT DETECTED Final   Candida krusei NOT DETECTED NOT DETECTED Final   Candida parapsilosis NOT DETECTED NOT DETECTED Final   Candida tropicalis NOT DETECTED NOT DETECTED Final    Comment: Performed at [facility], [address], [place], [place] [address]  Urine culture     Status: Abnormal   Collection Time: 09/09/19 12:52 PM   Specimen: Urine, Clean Catch  Result Value Ref Range Status   Specimen Description   Final    URINE, CLEAN CATCH Performed at [facility], [address] [place], [place] [address]    Special Requests   Final    NONE Performed at [facility], [address] [place], [place] [address]    Culture (A)  Final    <10,000 COLONIES/mL INSIGNIFICANT GROWTH Performed at [facility], [address], [place], [place] [address]    Report Status 09/10/2019 FINAL  Final  SARS Coronavirus 2 [facility] order, Performed in [facility] hospital lab) Nasopharyngeal Nasopharyngeal Swab     Status: None   Collection Time: 09/10/19  5:12 AM   Specimen: Nasopharyngeal Swab  Result Value Ref Range Status   SARS Coronavirus 2 NEGATIVE NEGATIVE Final    Comment: (NOTE) If result is NEGATIVE SARS-CoV-2 target nucleic acids are NOT DETECTED. The SARS-CoV-2 RNA is generally detectable in upper and lower  respiratory specimens during the acute phase of infection. The lowest  concentration of SARS-CoV-2 viral copies this assay can detect is 250  copies / mL. A negative result does not preclude SARS-CoV-2 infection  and should not be used as the sole basis for treatment or other  patient management decisions.  A negative result may occur with  improper specimen collection / handling, submission of specimen other  than nasopharyngeal swab,  presence of viral  mutation(s) within the  areas targeted by this assay, and inadequate number of viral copies  (<250 copies / mL). A negative result must be combined with clinical  observations, patient history, and epidemiological information. If result is POSITIVE SARS-CoV-2 target nucleic acids are DETECTED. The SARS-CoV-2 RNA is generally detectable in upper and lower  respiratory specimens dur ing the acute phase of infection.  Positive  results are indicative of active infection with SARS-CoV-2.  Clinical  correlation with patient history and other diagnostic information is  necessary to determine patient infection status.  Positive results do  not rule out bacterial infection or co-infection with other viruses. If result is PRESUMPTIVE POSTIVE SARS-CoV-2 nucleic acids MAY BE PRESENT.   A presumptive positive result was obtained on the submitted specimen  and confirmed on repeat testing.  While 2019 novel coronavirus  (SARS-CoV-2) nucleic acids may be present in the submitted sample  additional confirmatory testing may be necessary for epidemiological  and / or clinical management purposes  to differentiate between  SARS-CoV-2 and other Sarbecovirus currently known to infect humans.  If clinically indicated additional testing with an alternate test  methodology [phone]) is advised. The SARS-CoV-2 RNA is generally  detectable in upper and lower respiratory sp ecimens during the acute  phase of infection. The expected result is Negative. Fact Sheet for Patients:  BoilerBrush.com.cy Fact Sheet for Healthcare Providers: https://pope.com/ This test is not yet approved or cleared by the [place] FDA and has been authorized for detection and/or diagnosis of SARS-CoV-2 by FDA under an Emergency Use Authorization (EUA).  This EUA will remain in effect (meaning this test can be used) for the duration of the COVID-19 declaration under Section 564(b)(1)  of the Act, 21 U.S.C. section 360bbb-3(b)(1), unless the authorization is terminated or revoked sooner. Performed at [facility], [address] [place], [place] [address]   Culture, blood (routine x 2)     Status: None (Preliminary result)   Collection Time: 09/10/19  8:06 PM   Specimen: BLOOD  Result Value Ref Range Status   Specimen Description BLOOD RIGHT ANTECUBITAL  Final   Special Requests   Final    BOTTLES DRAWN AEROBIC AND ANAEROBIC Blood Culture adequate volume   Culture   Final    NO GROWTH 3 DAYS Performed at [facility], [address], [place], [place] [address]    Report Status PENDING  Incomplete  Culture, blood (routine x 2)     Status: None (Preliminary result)   Collection Time: 09/10/19  8:14 PM   Specimen: BLOOD  Result Value Ref Range Status   Specimen Description BLOOD LEFT ANTECUBITAL  Final   Special Requests   Final    BOTTLES DRAWN AEROBIC ONLY Blood Culture adequate volume   Culture   Final    NO GROWTH 3 DAYS Performed at [facility], [address], [place], [place] [address]    Report Status PENDING  Incomplete      Radiology Studies: Ct Abdomen Pelvis W Contrast  Addendum Date: 09/11/2019   ADDENDUM REPORT: 09/11/2019 22:04 ADDENDUM: These results were called by telephone on 09/11/2019 at 10:04 pm to provider Tama Gander , who verbally acknowledged these results. Electronically Signed   By: Kreg Shropshire M.D.   On: 09/11/2019 22:04   Result Date: 09/11/2019 CLINICAL DATA:  Generalized abdominal pain, nausea and malaise EXAM: CT ABDOMEN AND PELVIS WITH CONTRAST TECHNIQUE: Multidetector CT imaging of the abdomen  and pelvis was performed using the standard protocol following bolus administration of intravenous contrast. CONTRAST:  OMNIPAQUE IOHEXOL 300 MG/ML  SOLN COMPARISON:  CT 12/05/2018 FINDINGS: Lower chest: Bandlike areas of scarring and/or atelectasis in the lung bases. Normal heart size. No pericardial  effusion. Hepatobiliary: No focal liver abnormality is seen. No gallstones, gallbladder wall thickening, or biliary dilatation. Pancreas: Unremarkable. No pancreatic ductal dilatation or surrounding inflammatory changes. Spleen: Normal in size without focal abnormality. Adrenals/Urinary Tract: There is calcification involving the medial limb of the right adrenal gland which could reflect prior adrenal hemorrhage or sequela of remote infection. The body of the right adrenal gland and the left adrenal gland have a more normal appearance. Few areas of cortical scarring involving the left kidney are similar to prior as is mild symmetric perinephric stranding. Kidneys enhance symmetrically. No concerning renal lesions or urolithiasis. No hydronephrosis. The bladder is circumferentially thickened with perivesicular hazy stranding. Stomach/Bowel: Distal esophagus, stomach and duodenal sweep are unremarkable. Proximal small bowel is free of abnormality. There is mild thickening of the terminal ileum and cecum in the right lower quadrant but with notable stranding and inflammation centered predominantly on a draining, slightly dilated mesenteric venous branch, which can be followed proximally to the superior mesenteric vein. A normal appendix is visualized. Scattered colonic diverticula without focal pericolonic inflammation to suggest diverticulitis. No other colonic dilatation or wall thickening. Vascular/Lymphatic: Stranding and thickening appears centered predominantly upon a at somewhat dilated mesenteric venous branch which courses to the inflamed loops of small bowel and cecum in the right lower quadrant. Surrounding mesenteric adenopathy is present as well. Atherosclerotic plaque within the normal caliber aorta. No pathologically enlarged lymph nodes. Reproductive: The prostate and seminal vesicles are unremarkable. Other: Trace reactive free fluid noted in the right pericolic gutter tracking to the right pelvis. No  free intraperitoneal air. No organized collection or abscess. No bowel containing hernia. Small fat containing umbilical hernia and bilateral fat containing inguinal hernias. 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 IMPRESSION: 1. Mild thickening of the terminal ileum and cecum in the right lower quadrant with stranding and thickening centered predominantly upon the draining mesenteric venous branch. Surrounding mesenteric adenopathy is present as well. While findings are nonspecific, overall appearance raises concern for potential veno-occlusive mesenteric ischemia versus an enterocolitis. 2. Circumferentially thickened bladder with perivesicular hazy stranding, suggestive of cystitis. Correlate with urinalysis. 3. Aortic Atherosclerosis (ICD10-I70.0). Electronically Signed: By: Kreg Shropshire M.D. On: 09/11/2019 21:47   Ct Maxillofacial Ltd Wo Cm  Result Date: 09/11/2019 CLINICAL DATA:  Sinusitis, malaise EXAM: CT PARANASAL SINUS LIMITED WITHOUT CONTRAST TECHNIQUE: Multidetector CT images of the paranasal sinuses were obtained using the standard protocol without intravenous contrast. COMPARISON:  None. FINDINGS: Paranasal sinuses: Frontal: Normally aerated. Patent frontal sinus drainage pathways. Ethmoid: Normally aerated. Maxillary: Normally aerated. Sphenoid: Normally aerated. Patent sphenoethmoidal recesses. Sphenoid sinus septum inserts eccentric Lea into the left lateral wall of the sphenoid sinus near the carotid canal. Right ostiomeatal unit: Patent. Left ostiomeatal unit: Patent. Nasal passages: Patent. Asymmetric mucosal fullness the left turbinates likely related to the normal nasal cycle.Leftward nasal septal deviation with a contacting nasal septal spur. Anatomy: No pneumatization superior to anterior ethmoid notches. Symmetric and intact olfactory grooves and fovea  ethmoidalis, Keros I (1-75mm). Sellar sphenoid pneumatization pattern. No dehiscence of carotid or optic canals. No onodi cell. Other: Orbits and intracranial compartment are unremarkable. Visible mastoid air cells are  normally aerated. There is extensive pneumatization of the petrous apices. Middle ear cavities are clear. There is debris within both external auditory canals. IMPRESSION: Normally aerated paranasal sinuses. Patent sinus drainage pathways. Debris in the external auditory canals, likely cerumen impaction. Leftward nasal septal deviation with a contacting left nasal septal spur. Pneumatization of the petrous apices. Electronically Signed   By: Kreg Shropshire M.D.   On: 09/11/2019 21:55    LOS: 3 days   Time spent: More than 50% of that time was spent in counseling and/or coordination of care.  Lanae Boast, MD [facility]  09/13/2019, 3:08 PM

## 2019-09-13 NOTE — Progress Notes (Signed)
Subjective: Little more lower abdominal cramping. Having constipation and pellet-like stools.  Objective: Vital signs in last 24 hours: Temp:  [98.3 F (36.8 C)-100 F (37.8 C)] 98.8 F (37.1 C) (09/30 0756) Pulse Rate:  [58-63] 61 (09/30 0756) Resp:  [16-20] 16 (09/30 0756) BP: (139-173)/(65-77) 159/71 (09/30 0756) SpO2:  [97 %-99 %] 98 % (09/30 0756) Weight change:  Last BM Date: 09/12/19  PE: GEN:  NAD ABD:  Mild protuberant, mild lower abdominal tenderness without peritonitis  Lab Results: CBC    Component Value Date/Time   WBC 5.9 09/13/2019 0226   RBC 3.86 (L) 09/13/2019 0226   HGB 11.7 (L) 09/13/2019 0226   HCT 33.7 (L) 09/13/2019 0226   PLT 160 09/13/2019 0226   MCV 87.3 09/13/2019 0226   MCH 30.3 09/13/2019 0226   MCHC 34.7 09/13/2019 0226   RDW 14.1 09/13/2019 0226   LYMPHSABS 1.0 09/11/2019 0329   MONOABS 0.7 09/11/2019 0329   EOSABS 0.1 09/11/2019 0329   BASOSABS 0.0 09/11/2019 0329   CMP     Component Value Date/Time   NA 137 09/13/2019 0226   K 3.7 09/13/2019 0226   CL 103 09/13/2019 0226   CO2 26 09/13/2019 0226   GLUCOSE 144 (H) 09/13/2019 0226   BUN 9 09/13/2019 0226   CREATININE 0.79 09/13/2019 0226   CALCIUM 9.1 09/13/2019 0226   PROT 6.3 (L) 09/13/2019 0226   ALBUMIN 2.9 (L) 09/13/2019 0226   AST 59 (H) 09/13/2019 0226   ALT 120 (H) 09/13/2019 0226   ALKPHOS 99 09/13/2019 0226   BILITOT 0.6 09/13/2019 0226   GFRNONAA >60 09/13/2019 0226   GFRAA >60 09/13/2019 0226   Assessment:  1.  Klebsiella bacteremia with lower abdominal pain, fevers, chills, rigors. 2.  Abnormal CT scan, right lower quadrant inflammatory changes. 3.  Significant improvement with antibiotics and hydration.  Suspect from cecal/ascending colon diverticulitis.  Symptoms rather abrupt for inflammatory bowel disease.  Spontaneous bowel infection of colon isolated in proximal colon is not typical for Klebsiella.  Clinically symptoms not atypical for mesenteric ischemia,  but isolated involvement of ileocecal region with sparing of more distal ascending colon and proximal transverse colon would be unusual.  Plan:  1.  Miralax po and Dulcolax pr. 2.  If feels better after having bowel movement, ok to go home today; if not, might consider CT angiogram abdomen/pelvis. 3.  Case discussed with Dr. Maren Beach.   Landry Dyke 09/13/2019, 11:03 AM   Cell [phone] If no answer or after 5 PM call [phone]

## 2019-09-13 NOTE — Plan of Care (Signed)
  Problem: Clinical Measurements: Goal: Ability to maintain clinical measurements within normal limits will improve Outcome: Progressing Goal: Will remain free from infection Outcome: Progressing Goal: Diagnostic test results will improve Outcome: Progressing   Problem: Coping: Goal: Level of anxiety will decrease Outcome: Progressing   Problem: Elimination: Goal: Will not experience complications related to bowel motility Outcome: Progressing

## 2019-09-14 LAB — BASIC METABOLIC PANEL
Anion gap: 9 (ref 5–15)
BUN: 8 mg/dL (ref 8–23)
CO2: 26 mmol/L (ref 22–32)
Calcium: 9.3 mg/dL (ref 8.9–10.3)
Chloride: 103 mmol/L (ref 98–111)
Creatinine, Ser: 0.76 mg/dL (ref 0.61–1.24)
GFR calc Af Amer: >60 mL/min (ref >60)
GFR calc non Af Amer: >60 mL/min (ref >60)
Glucose, Bld: 157 mg/dL — ABNORMAL HIGH (ref 70–99)
Potassium: 3.9 mmol/L (ref 3.5–5.1)
Sodium: 138 mmol/L (ref 135–145)

## 2019-09-14 LAB — CBC
HCT: 36 % — ABNORMAL LOW (ref 39.0–52.0)
Hemoglobin: 12.3 g/dL — ABNORMAL LOW (ref 13.0–17.0)
MCH: 30.5 pg (ref 26.0–34.0)
MCHC: 34.2 g/dL (ref 30.0–36.0)
MCV: 89.3 fL (ref 80.0–100.0)
Platelets: 216 10*3/uL (ref 150–400)
RBC: 4.03 MIL/uL — ABNORMAL LOW (ref 4.22–5.81)
RDW: 14 % (ref 11.5–15.5)
WBC: 6.5 10*3/uL (ref 4.0–10.5)
nRBC: 0 % (ref 0.0–0.2)

## 2019-09-14 LAB — GLUCOSE, CAPILLARY
Glucose-Capillary: 167 mg/dL — ABNORMAL HIGH (ref 70–99)
Glucose-Capillary: 157 mg/dL — ABNORMAL HIGH (ref 70–99)

## 2019-09-14 MED ORDER — POLYETHYLENE GLYCOL 3350 17 G PO PACK: 17.0000 g | PACK | Freq: Two times a day (BID) | ORAL | 0 refills | Status: AC

## 2019-09-14 NOTE — Progress Notes (Signed)
Discharge instructions addressed;Pt in stable condition;not in respiratory distress; Pt to be picked up by wife at the [facility] entrance.

## 2019-09-14 NOTE — Progress Notes (Signed)
Subjective: Abdominal discomfort improving after bowel movements. Tolerating diet.  Objective: Vital signs in last 24 hours: Temp:  [98.9 F (37.2 C)-99.6 F (37.6 C)] 98.9 F (37.2 C) (10/01 0350) Pulse Rate:  [53-63] 53 (10/01 0350) Resp:  [16-17] 17 (10/01 0350) BP: (126-169)/(71-81) 126/81 (10/01 0350) SpO2:  [98 %-99 %] 98 % (10/01 0350) Weight change:  Last BM Date: 09/13/19  PE: GEN:  NAD ABD: Soft, mild protuberant, mild lower abdominal tenderness (improving)  Lab Results: CBC    Component Value Date/Time   WBC 6.5 09/14/2019 0305   RBC 4.03 (L) 09/14/2019 0305   HGB 12.3 (L) 09/14/2019 0305   HCT 36.0 (L) 09/14/2019 0305   PLT 216 09/14/2019 0305   MCV 89.3 09/14/2019 0305   MCH 30.5 09/14/2019 0305   MCHC 34.2 09/14/2019 0305   RDW 14.0 09/14/2019 0305   LYMPHSABS 1.0 09/11/2019 0329   MONOABS 0.7 09/11/2019 0329   EOSABS 0.1 09/11/2019 0329   BASOSABS 0.0 09/11/2019 0329   CMP     Component Value Date/Time   NA 138 09/14/2019 0305   K 3.9 09/14/2019 0305   CL 103 09/14/2019 0305   CO2 26 09/14/2019 0305   GLUCOSE 157 (H) 09/14/2019 0305   BUN 8 09/14/2019 0305   CREATININE 0.76 09/14/2019 0305   CALCIUM 9.3 09/14/2019 0305   PROT 6.3 (L) 09/13/2019 0226   ALBUMIN 2.9 (L) 09/13/2019 0226   AST 59 (H) 09/13/2019 0226   ALT 120 (H) 09/13/2019 0226   ALKPHOS 99 09/13/2019 0226   BILITOT 0.6 09/13/2019 0226   GFRNONAA >60 09/14/2019 0305   GFRAA >60 09/14/2019 0305   Assessment:  1. Klebsiella bacteremia with lower abdominal pain, fevers, chills, rigors. 2. Abnormal CT scan, right lower quadrant inflammatory changes. 3. Significant improvement with antibiotics and hydration. Suspect from cecal/ascending colon diverticulitis. Symptoms rather abrupt for inflammatory bowel disease. Spontaneous bowel infection of colon isolated in proximal colon is not typical for Klebsiella. Clinically symptoms not atypical for mesenteric ischemia, but isolated  involvement of ileocecal region with sparing of more distal ascending colon and proximal transverse colon would be unusual.  Plan:  1.  OK to discharge patient home today from GI perspective. 2.  Will need 10-14 day course antibiotics for bacteremia (Augmentin 875 mg po bid has been sent to pharmacy per yesterday's [facility] note). 3.  Miralax 17 grams po qd upon discharge. 4.  We will arrange outpatient follow-up with [facility] [phone] in the next 4-6 weeks. 5.  Soft/carb modified diet ok. 6.  [facility] will sign-off; please call with questions; thank you for the consultation.   Arthur Spencer 09/14/2019, 10:03 AM   Cell [phone] If no answer or after 5 PM call [phone]

## 2019-09-14 NOTE — Discharge Summary (Signed)
Physician Discharge Summary  Aryan Sparks HYQ:[id number] DOB: 07-10-1946 DOA: 09/10/2019  PCP: Caffie Damme, MD  Admit date: 09/10/2019 Discharge date: 09/14/2019  Admitted From: Home  Discharge disposition: Home  Recommendations for Outpatient Follow-Up:   Follow up with your primary care provider in one week.  Follow-up with GI in 1 to 2 weeks.   Discharge Diagnosis:   Principal Problem:   Bacteremia due to Gram-negative bacteria Active Problems:   DM (diabetes mellitus), type 2 (HCC)   Lymphopenia   Thrombocytopenia (HCC)   Discharge Condition: Improved.  Diet recommendation:   Carbohydrate-modified.  Soft diet.  Wound care: None.  Code status: Full.   History of Present Illness:   73 y.o.malewith medical history significant fortype 2 diabetes mellitus, hypertension, and hyperlipidemia, presented to the hospital for admission for positive blood culture.Patient had been in his usual state of health until 09/06/2019 when he developed acute onset of generalized abdominal pain, nausea, and malaise. The abdominal pain resolved, but he continued to feel generally weak.He denied vomiting, diarrhea, or dysuria. He also denied flank pain. He has a chronic cough that he attributes to sinus problems, but denies any change in that or new shortness of breath. Patient had an office-based procedure 2 months ago, bilateral resection of inferior turbinates, and has not had any issues from that. He was seen in the ED yesterday, cultures were drawn, he received broad-spectrum antibiotics, felt better, and was discharged with Augmentin for possible CAP. Blood cultures grew Klebsiella, sensitivities pending, and the patient was called back and instructed to present to the hospital for direct admission.   On arrival, patient is afebrile with normal heart rate and normal blood pressure, saturating well on room air, appears fatigued, but in no distress. Patient was admitted,treated with  ceftriaxone for Klebsiella bacteremia. CT abdomen that shows mild thickening of the terminal ileum and cecum in the right lower quadrant with stranding and thickening concerning for potential venoocclusive mesenteric ischemia versus enterocolitis.    Hospital Course:   Following conditions were addressed during hospitalization,  Klebsiella bacteremia with lower abdominal pain, fever, chills:  Source of infection is a completely unclear the there is a possibility of diverticulitis as being the possible source.  Patient was seen by GI during hospitalization and initially received imipenem was advised changed to Rocephin. Repeat blood culture negative so far from  9/27.   Patient remained afebrile and is total WBC is 6.5.  Previous provider has already sent Augmentin 875/125 twice daily x10 days to his pharmacy.   Abnormal CT scan with inflammatory changes in the right lower quadrant: Significant improvement with antibiotics, IV fluids.  Patient was seen by GI  suspecting cecal/ascending colon diverticulitis.  Seen by GI symptoms not typical for mesenteric ischemia but isolated involvement of ileocecal region with sparing of more distal ascending colon and proximal transverse colon would be unusual.  At this time, recommending antibiotics.  Patient did have constipation and with with MiraLAX and Dulcolax he has felt better.  Patient was seen by GI for follow-up and has been considered stable for disposition home.  Lymphopenia; thrombocytopenia:WBC was 3,400 in ED with absolute lymphocytes 170, Platelet count 102k in ED, previously normal. suspect due to #1.  Cbc improved after antibiotics including WBC of 6.5 and platelets of 216.  Type II DM, hemoglobin A1c of 6.8, sugar controlled, on ssi during hospitalization. Resume metformin upon discharge.  Hypertension: stable.  Resume home medications on discharge.    Medical Consultants:   GI  Subjective:   Today, patient feels okay.  Wants  to go home.  Denies any nausea, vomiting or abdominal pain.  Denies any fever, chills or rigor.  Discharge Exam:   Vitals:   09/13/19 2015 09/14/19 0350  BP: (!) 169/71 126/81  Pulse: 63 (!) 53  Resp: 16 17  Temp: 99.6 F (37.6 C) 98.9 F (37.2 C)  SpO2: 99% 98%   Vitals:   09/13/19 0756 09/13/19 1416 09/13/19 2015 09/14/19 0350  BP: (!) 159/71 136/73 (!) 169/71 126/81  Pulse: 61 (!) 57 63 (!) 53  Resp: 16 16 16 17   Temp: 98.8 F (37.1 C) 99.4 F (37.4 C) 99.6 F (37.6 C) 98.9 F (37.2 C)  TempSrc: Oral Oral Oral Oral  SpO2: 98% 98% 99% 98%  Weight:      Height:        General exam: Appears calm and comfortable ,Not in distress HEENT:PERRL,Oral mucosa moist Respiratory system: Bilateral equal air entry, normal vesicular breath sounds, no wheezes or crackles  Cardiovascular system: S1 & S2 heard, RRR.  Gastrointestinal system: Abdomen is nondistended, mildly obese abdomen, soft and nontender. No organomegaly or masses felt. Normal bowel sounds heard. Central nervous system: Alert and oriented. No focal neurological deficits. Extremities: No edema, no clubbing ,no cyanosis, distal peripheral pulses palpable. Skin: No rashes, lesions or ulcers,no icterus ,no pallor MSK: Normal muscle bulk,tone ,power    Procedures:    None  The results of significant diagnostics from this hospitalization (including imaging, microbiology, ancillary and laboratory) are listed below for reference.     Diagnostic Studies:   Ct Abdomen Pelvis W Contrast  Addendum Date: 09/11/2019   ADDENDUM REPORT: 09/11/2019 22:04 ADDENDUM: These results were called by telephone on 09/11/2019 at 10:04 pm to provider Tama Gander , who verbally acknowledged these results. Electronically Signed   By: Kreg Shropshire M.D.   On: 09/11/2019 22:04   Result Date: 09/11/2019 CLINICAL DATA:  Generalized abdominal pain, nausea and malaise EXAM: CT ABDOMEN AND PELVIS WITH CONTRAST TECHNIQUE: Multidetector CT  imaging of the abdomen and pelvis was performed using the standard protocol following bolus administration of intravenous contrast. CONTRAST:  OMNIPAQUE IOHEXOL 300 MG/ML  SOLN COMPARISON:  CT 12/05/2018 FINDINGS: Lower chest: Bandlike areas of scarring and/or atelectasis in the lung bases. Normal heart size. No pericardial effusion. Hepatobiliary: No focal liver abnormality is seen. No gallstones, gallbladder wall thickening, or biliary dilatation. Pancreas: Unremarkable. No pancreatic ductal dilatation or surrounding inflammatory changes. Spleen: Normal in size without focal abnormality. Adrenals/Urinary Tract: There is calcification involving the medial limb of the right adrenal gland which could reflect prior adrenal hemorrhage or sequela of remote infection. The body of the right adrenal gland and the left adrenal gland have a more normal appearance. Few areas of cortical scarring involving the left kidney are similar to prior as is mild symmetric perinephric stranding. Kidneys enhance symmetrically. No concerning renal lesions or urolithiasis. No hydronephrosis. The bladder is circumferentially thickened with perivesicular hazy stranding. Stomach/Bowel: Distal esophagus, stomach and duodenal sweep are unremarkable. Proximal small bowel is free of abnormality. There is mild thickening of the terminal ileum and cecum in the right lower quadrant but with notable stranding and inflammation centered predominantly on a draining, slightly dilated mesenteric venous branch, which can be followed proximally to the superior mesenteric vein. A normal appendix is visualized. Scattered colonic diverticula without focal pericolonic inflammation to suggest diverticulitis. No other colonic dilatation or wall thickening. Vascular/Lymphatic: Stranding and thickening appears centered predominantly upon  a at somewhat dilated mesenteric venous branch which courses to the inflamed loops of small bowel and cecum in the right  lower quadrant. Surrounding mesenteric adenopathy is present as well. Atherosclerotic plaque within the normal caliber aorta. No pathologically enlarged lymph nodes. Reproductive: The prostate and seminal vesicles are unremarkable. Other: Trace reactive free fluid noted in the right pericolic gutter tracking to the right pelvis. No free intraperitoneal air. No organized collection or abscess. No bowel containing hernia. Small fat containing umbilical hernia and bilateral fat containing inguinal hernias. 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 IMPRESSION: 1. Mild thickening of the terminal ileum and cecum in the right lower quadrant with stranding and thickening centered predominantly upon the draining mesenteric venous branch. Surrounding mesenteric adenopathy is present as well. While findings are nonspecific, overall appearance raises concern for potential veno-occlusive mesenteric ischemia versus an enterocolitis. 2. Circumferentially thickened bladder with perivesicular hazy stranding, suggestive of cystitis. Correlate with urinalysis. 3. Aortic Atherosclerosis (ICD10-I70.0). Electronically Signed: By: Kreg Shropshire M.D. On: 09/11/2019 21:47   Ct Maxillofacial Ltd Wo Cm  Result Date: 09/11/2019 CLINICAL DATA:  Sinusitis, malaise EXAM: CT PARANASAL SINUS LIMITED WITHOUT CONTRAST TECHNIQUE: Multidetector CT images of the paranasal sinuses were obtained using the standard protocol without intravenous contrast. COMPARISON:  None. FINDINGS: Paranasal sinuses: Frontal: Normally aerated. Patent frontal sinus drainage pathways. Ethmoid: Normally aerated. Maxillary: Normally aerated. Sphenoid: Normally aerated. Patent sphenoethmoidal recesses. Sphenoid sinus septum inserts eccentric Lea into the left lateral wall of the sphenoid sinus near the carotid canal. Right ostiomeatal unit: Patent.  Left ostiomeatal unit: Patent. Nasal passages: Patent. Asymmetric mucosal fullness the left turbinates likely related to the normal nasal cycle.Leftward nasal septal deviation with a contacting nasal septal spur. Anatomy: No pneumatization superior to anterior ethmoid notches. Symmetric and intact olfactory grooves and fovea ethmoidalis, Keros I (1-20mm). Sellar sphenoid pneumatization pattern. No dehiscence of carotid or optic canals. No onodi cell. Other: Orbits and intracranial compartment are unremarkable. Visible mastoid air cells are normally aerated. There is extensive pneumatization of the petrous apices. Middle ear cavities are clear. There is debris within both external auditory canals. IMPRESSION: Normally aerated paranasal sinuses. Patent sinus drainage pathways. Debris in the external auditory canals, likely cerumen impaction. Leftward nasal septal deviation with a contacting left nasal septal spur. Pneumatization of the petrous apices. Electronically Signed   By: Kreg Shropshire M.D.   On: 09/11/2019 21:55     Labs:   Basic Metabolic Panel: Recent Labs  Lab 09/09/19 1055 09/10/19 2014 09/11/19 0329 09/13/19 0226 09/14/19 0305  NA 136 135 137 137 138  K 3.9 3.6 3.6 3.7 3.9  CL 100 101 105 103 103  CO2 23 22 24 26 26   GLUCOSE 199* 126* 148* 144* 157*  BUN 17 9 8 9 8   CREATININE 0.99 0.97 0.79 0.79 0.76  CALCIUM 9.0 9.1 8.8* 9.1 9.3   GFR Estimated Creatinine Clearance: 105 mL/min (by C-G formula based on SCr of 0.76 mg/dL). Liver Function Tests: Recent Labs  Lab 09/09/19 1055 09/10/19 2014 09/11/19 0329 09/13/19 0226  AST 74* 95* 83* 59*  ALT 105* 129* 116* 120*  ALKPHOS 90 86 77 99  BILITOT 0.8 0.7 1.1 0.6  PROT 6.6 6.6 5.7* 6.3*  ALBUMIN 3.8 3.4* 2.9* 2.9*   No results for input(s): LIPASE, AMYLASE in the last 168 hours. No results for input(s): AMMONIA in the last 168 hours. Coagulation profile No results for  input(s): INR, PROTIME in the last 168  hours.  CBC: Recent Labs  Lab 09/09/19 1055 09/10/19 2014 09/11/19 0329 09/13/19 0226 09/14/19 0305  WBC 3.4* 6.7 5.6 5.9 6.5  NEUTROABS 3.0 4.8 3.8  --   --   HGB 12.2* 12.3* 11.4* 11.7* 12.3*  HCT 37.8* 36.1* 32.7* 33.7* 36.0*  MCV 91.5 89.6 88.9 87.3 89.3  PLT 102* 117* 120* 160 216   Cardiac Enzymes: No results for input(s): CKTOTAL, CKMB, CKMBINDEX, TROPONINI in the last 168 hours. BNP: Invalid input(s): POCBNP CBG: Recent Labs  Lab 09/13/19 0640 09/13/19 1131 09/13/19 1611 09/13/19 2016 09/14/19 0641  GLUCAP 138* 174* 183* 155* 167*   D-Dimer No results for input(s): DDIMER in the last 72 hours. Hgb A1c No results for input(s): HGBA1C in the last 72 hours. Lipid Profile No results for input(s): CHOL, HDL, LDLCALC, TRIG, CHOLHDL, LDLDIRECT in the last 72 hours. Thyroid function studies No results for input(s): TSH, T4TOTAL, T3FREE, THYROIDAB in the last 72 hours.  Invalid input(s): FREET3 Anemia work up No results for input(s): VITAMINB12, FOLATE, FERRITIN, TIBC, IRON, RETICCTPCT in the last 72 hours. Microbiology Recent Results (from the past 240 hour(s))  Blood culture (routine x 2)     Status: Abnormal   Collection Time: 09/09/19 11:13 AM   Specimen: BLOOD RIGHT ARM  Result Value Ref Range Status   Specimen Description   Final    BLOOD RIGHT ARM Performed at [facility], [address] [place], [place] [address]    Special Requests   Final    BOTTLES DRAWN AEROBIC AND ANAEROBIC Blood Culture adequate volume Performed at [facility], [address] [place], [place] [address]    Culture  Setup Time   Final    GRAM NEGATIVE RODS IN BOTH AEROBIC AND ANAEROBIC BOTTLES CRITICAL VALUE NOTED.  VALUE IS CONSISTENT WITH PREVIOUSLY REPORTED AND CALLED VALUE.    Culture (A)  Final    KLEBSIELLA PNEUMONIAE SUSCEPTIBILITIES PERFORMED ON PREVIOUS CULTURE WITHIN THE LAST 5 DAYS. Performed at [facility], [address]  [place], [place] [address]    Report Status 09/12/2019 FINAL  Final  Blood culture (routine x 2)     Status: Abnormal   Collection Time: 09/09/19 11:13 AM   Specimen: BLOOD LEFT ARM  Result Value Ref Range Status   Specimen Description   Final    BLOOD LEFT ARM Performed at [facility], [address] [place], [place] [address]    Special Requests   Final    BOTTLES DRAWN AEROBIC AND ANAEROBIC Blood Culture adequate volume Performed at [facility], [address] [place], [place] [address]    Culture  Setup Time   Final    GRAM NEGATIVE RODS IN BOTH AEROBIC AND ANAEROBIC BOTTLES CRITICAL RESULT CALLED TO, READ BACK BY AND VERIFIED WITH: RN AT [facility] S GOUGE [id number] AT 1122 BY CM Performed at [facility], [address] [place], [place] [address]    Culture KLEBSIELLA PNEUMONIAE (A)  Final   Report Status 09/12/2019 FINAL  Final   Organism ID, Bacteria KLEBSIELLA PNEUMONIAE  Final      Susceptibility   Klebsiella pneumoniae - MIC*    AMPICILLIN >=32 RESISTANT Resistant     CEFAZOLIN <=4 SENSITIVE Sensitive     CEFEPIME <=1 SENSITIVE Sensitive     CEFTAZIDIME <=1 SENSITIVE Sensitive     CEFTRIAXONE <=1 SENSITIVE Sensitive  CIPROFLOXACIN <=0.25 SENSITIVE Sensitive     GENTAMICIN <=1 SENSITIVE Sensitive     IMIPENEM <=0.25 SENSITIVE Sensitive     TRIMETH/SULFA <=20 SENSITIVE Sensitive     AMPICILLIN/SULBACTAM 4 SENSITIVE Sensitive     PIP/TAZO 8 SENSITIVE Sensitive     * KLEBSIELLA PNEUMONIAE  SARS Coronavirus 2 [facility] order, Performed in [facility] hospital lab) Nasopharyngeal Nasopharyngeal Swab     Status: None   Collection Time: 09/09/19 11:13 AM   Specimen: Nasopharyngeal Swab  Result Value Ref Range Status   SARS Coronavirus 2 NEGATIVE NEGATIVE Final    Comment: (NOTE) If result is NEGATIVE SARS-CoV-2 target nucleic acids are NOT DETECTED. The SARS-CoV-2 RNA is generally detectable in upper and lower  respiratory specimens during the  acute phase of infection. The lowest  concentration of SARS-CoV-2 viral copies this assay can detect is 250  copies / mL. A negative result does not preclude SARS-CoV-2 infection  and should not be used as the sole basis for treatment or other  patient management decisions.  A negative result may occur with  improper specimen collection / handling, submission of specimen other  than nasopharyngeal swab, presence of viral mutation(s) within the  areas targeted by this assay, and inadequate number of viral copies  (<250 copies / mL). A negative result must be combined with clinical  observations, patient history, and epidemiological information. If result is POSITIVE SARS-CoV-2 target nucleic acids are DETECTED. The SARS-CoV-2 RNA is generally detectable in upper and lower  respiratory specimens dur ing the acute phase of infection.  Positive  results are indicative of active infection with SARS-CoV-2.  Clinical  correlation with patient history and other diagnostic information is  necessary to determine patient infection status.  Positive results do  not rule out bacterial infection or co-infection with other viruses. If result is PRESUMPTIVE POSTIVE SARS-CoV-2 nucleic acids MAY BE PRESENT.   A presumptive positive result was obtained on the submitted specimen  and confirmed on repeat testing.  While 2019 novel coronavirus  (SARS-CoV-2) nucleic acids may be present in the submitted sample  additional confirmatory testing may be necessary for epidemiological  and / or clinical management purposes  to differentiate between  SARS-CoV-2 and other Sarbecovirus currently known to infect humans.  If clinically indicated additional testing with an alternate test  methodology [phone]) is advised. The SARS-CoV-2 RNA is generally  detectable in upper and lower respiratory sp ecimens during the acute  phase of infection. The expected result is Negative. Fact Sheet for Patients:   BoilerBrush.com.cy Fact Sheet for Healthcare Providers: https://pope.com/ This test is not yet approved or cleared by the [place] FDA and has been authorized for detection and/or diagnosis of SARS-CoV-2 by FDA under an Emergency Use Authorization (EUA).  This EUA will remain in effect (meaning this test can be used) for the duration of the COVID-19 declaration under Section 564(b)(1) of the Act, 21 U.S.C. section 360bbb-3(b)(1), unless the authorization is terminated or revoked sooner. Performed at [facility], [address] [place], [place] [address]   Blood Culture ID Panel (Reflexed)     Status: Abnormal   Collection Time: 09/09/19 11:13 AM  Result Value Ref Range Status   Enterococcus species NOT DETECTED NOT DETECTED Final   Listeria monocytogenes NOT DETECTED NOT DETECTED Final   Staphylococcus species NOT DETECTED NOT DETECTED Final   Staphylococcus aureus (BCID) NOT DETECTED NOT DETECTED Final   Streptococcus species NOT DETECTED NOT DETECTED Final   Streptococcus agalactiae  NOT DETECTED NOT DETECTED Final   Streptococcus pneumoniae NOT DETECTED NOT DETECTED Final   Streptococcus pyogenes NOT DETECTED NOT DETECTED Final   Acinetobacter baumannii NOT DETECTED NOT DETECTED Final   Enterobacteriaceae species DETECTED (A) NOT DETECTED Final    Comment: Enterobacteriaceae represent a large family of gram-negative bacteria, not a single organism. CRITICAL RESULT CALLED TO, READ BACK BY AND VERIFIED WITH: RN AT [facility] S GOUGE [phone] AT 1122 BY CM    Enterobacter cloacae complex NOT DETECTED NOT DETECTED Final   Escherichia coli NOT DETECTED NOT DETECTED Final   Klebsiella oxytoca NOT DETECTED NOT DETECTED Final   Klebsiella pneumoniae DETECTED (A) NOT DETECTED Final    Comment: CRITICAL RESULT CALLED TO, READ BACK BY AND VERIFIED WITH: RN AT [facility] S GOUGE [phone] AT 1122 BY CM    Proteus species NOT DETECTED NOT  DETECTED Final   Serratia marcescens NOT DETECTED NOT DETECTED Final   Carbapenem resistance NOT DETECTED NOT DETECTED Final   Haemophilus influenzae NOT DETECTED NOT DETECTED Final   Neisseria meningitidis NOT DETECTED NOT DETECTED Final   Pseudomonas aeruginosa NOT DETECTED NOT DETECTED Final   Candida albicans NOT DETECTED NOT DETECTED Final   Candida glabrata NOT DETECTED NOT DETECTED Final   Candida krusei NOT DETECTED NOT DETECTED Final   Candida parapsilosis NOT DETECTED NOT DETECTED Final   Candida tropicalis NOT DETECTED NOT DETECTED Final    Comment: Performed at [facility], [address] [place], [place] [address]  Urine culture     Status: Abnormal   Collection Time: 09/09/19 12:52 PM   Specimen: Urine, Clean Catch  Result Value Ref Range Status   Specimen Description   Final    URINE, CLEAN CATCH Performed at [facility], [address] [place], [place] [address]    Special Requests   Final    NONE Performed at [facility], [address] [place], [place] [address]    Culture (A)  Final    <10,000 COLONIES/mL INSIGNIFICANT GROWTH Performed at [facility], [address] [place], [place] [address]    Report Status 09/10/2019 FINAL  Final  SARS Coronavirus 2 [facility](Hospital order, Performed in [facility]) Nasopharyngeal Nasopharyngeal Swab     Status: None   Collection Time: 09/10/19  5:12 AM   Specimen: Nasopharyngeal Swab  Result Value Ref Range Status   SARS Coronavirus 2 NEGATIVE NEGATIVE Final    Comment: (NOTE) If result is NEGATIVE SARS-CoV-2 target nucleic acids are NOT DETECTED. The SARS-CoV-2 RNA is generally detectable in upper and lower  respiratory specimens during the acute phase of infection. The lowest  concentration of SARS-CoV-2 viral copies this assay can detect is 250  copies / mL. A negative result does not preclude SARS-CoV-2 infection  and should not be used as the sole basis for treatment  or other  patient management decisions.  A negative result may occur with  improper specimen collection / handling, submission of specimen other  than nasopharyngeal swab, presence of viral mutation(s) within the  areas targeted by this assay, and inadequate number of viral copies  (<250 copies / mL). A negative result must be combined with clinical  observations, patient history, and epidemiological information. If result is POSITIVE SARS-CoV-2 target nucleic acids are DETECTED. The SARS-CoV-2 RNA is generally detectable in upper and lower  respiratory specimens dur ing the acute phase of infection.  Positive  results are indicative of active infection  with SARS-CoV-2.  Clinical  correlation with patient history and other diagnostic information is  necessary to determine patient infection status.  Positive results do  not rule out bacterial infection or co-infection with other viruses. If result is PRESUMPTIVE POSTIVE SARS-CoV-2 nucleic acids MAY BE PRESENT.   A presumptive positive result was obtained on the submitted specimen  and confirmed on repeat testing.  While 2019 novel coronavirus  (SARS-CoV-2) nucleic acids may be present in the submitted sample  additional confirmatory testing may be necessary for epidemiological  and / or clinical management purposes  to differentiate between  SARS-CoV-2 and other Sarbecovirus currently known to infect humans.  If clinically indicated additional testing with an alternate test  methodology [phone]) is advised. The SARS-CoV-2 RNA is generally  detectable in upper and lower respiratory sp ecimens during the acute  phase of infection. The expected result is Negative. Fact Sheet for Patients:  BoilerBrush.com.cy Fact Sheet for Healthcare Providers: https://pope.com/ This test is not yet approved or cleared by the [place] FDA and has been authorized for detection and/or diagnosis of  SARS-CoV-2 by FDA under an Emergency Use Authorization (EUA).  This EUA will remain in effect (meaning this test can be used) for the duration of the COVID-19 declaration under Section 564(b)(1) of the Act, 21 U.S.C. section 360bbb-3(b)(1), unless the authorization is terminated or revoked sooner. Performed at [facility], [address] [place], [place] [address]   Culture, blood (routine x 2)     Status: None (Preliminary result)   Collection Time: 09/10/19  8:06 PM   Specimen: BLOOD  Result Value Ref Range Status   Specimen Description BLOOD RIGHT ANTECUBITAL  Final   Special Requests   Final    BOTTLES DRAWN AEROBIC AND ANAEROBIC Blood Culture adequate volume   Culture   Final    NO GROWTH 4 DAYS Performed at [facility], [address], [place], [place] [address]    Report Status PENDING  Incomplete  Culture, blood (routine x 2)     Status: None (Preliminary result)   Collection Time: 09/10/19  8:14 PM   Specimen: BLOOD  Result Value Ref Range Status   Specimen Description BLOOD LEFT ANTECUBITAL  Final   Special Requests   Final    BOTTLES DRAWN AEROBIC ONLY Blood Culture adequate volume   Culture   Final    NO GROWTH 4 DAYS Performed at [facility], [address], [place], [place] [address]    Report Status PENDING  Incomplete     Discharge Instructions:   Discharge Instructions    Call MD for:  severe uncontrolled pain   Complete by: As directed    Call MD for:  temperature >100.4   Complete by: As directed    Diet - low sodium heart healthy   Complete by: As directed    Discharge instructions   Complete by: As directed    Please call call MD or return to ER for similar or worsening recurring problem that brought you to hospital or if any fever,nausea/vomiting,abdominal pain, uncontrolled pain, chest pain,  shortness of breath or any other alarming symptoms.  Please follow-up Dr Dulce Sellar for COLONOSCOPY in few weeks Please follow up  with your doctor as instructed in a week time and call the office for appointment.  Please avoid alcohol, smoking, or any other illicit substance and maintain healthy habits including taking your regular medications as prescribed.  You were cared for by a  hospitalist during your hospital stay. If you have any questions about your discharge medications or the care you received while you were in the hospital after you are discharged, you can call the unit and ask to speak with the hospitalist on call if the hospitalist that took care of you is not available.  Once you are discharged, your primary care physician will handle any further medical issues. Please note that NO REFILLS for any discharge medications will be authorized once you are discharged, as it is imperative that you return to your primary care physician (or establish a relationship with a primary care physician if you do not have one) for your aftercare needs so that they can reassess your need for medications and monitor your lab values   Discharge instructions   Complete by: As directed    Follow-up with your primary care physician within 1 week.  Follow-up with Dr. Heywood Iles, GI in 1 to 2 weeks.  Complete the course of antibiotic.   Increase activity slowly   Complete by: As directed    Increase activity slowly   Complete by: As directed      Allergies as of 09/14/2019   No Known Allergies     Medication List    STOP taking these medications   amoxicillin-clavulanate 500-125 MG tablet Commonly known as: Augmentin Replaced by: amoxicillin-clavulanate 875-125 MG tablet     TAKE these medications   amoxicillin-clavulanate 875-125 MG tablet Commonly known as: Augmentin Take 1 tablet by mouth every 12 (twelve) hours for 10 days. Replaces: amoxicillin-clavulanate 500-125 MG tablet   APPLE CIDER VINEGAR PO Take 1 capsule by mouth daily.   atorvastatin 40 MG tablet Commonly known as: LIPITOR Take 40 mg by mouth at bedtime.    Chromium 1 MG Caps Take 1 mg by mouth daily.   Cinnamon 500 MG capsule Take 500 mg by mouth daily.   docusate sodium 100 MG capsule Commonly known as: Colace Take 1 capsule (100 mg total) by mouth 2 (two) times daily as needed. What changed:   medication strength  how much to take  when to take this  reasons to take this   ezetimibe 10 MG tablet Commonly known as: ZETIA Take 10 mg by mouth daily.   Fish Oil 1000 MG Caps Take 1 capsule by mouth daily.   hydrochlorothiazide 25 MG tablet Commonly known as: HYDRODIURIL Take 25 mg by mouth daily.   lisinopril 5 MG tablet Commonly known as: ZESTRIL Take 5 mg by mouth daily.   metFORMIN 500 MG tablet Commonly known as: GLUCOPHAGE Take 500 mg by mouth 2 (two) times daily with a meal.   niacin 100 MG tablet Take 100 mg by mouth at bedtime.   polyethylene glycol 17 g packet Commonly known as: MIRALAX / GLYCOLAX Take 17 g by mouth 2 (two) times daily.   saw palmetto 160 MG capsule Take 160 mg by mouth daily.   vitamin C 100 MG tablet Take 100 mg by mouth daily.      Follow-up Information    Caffie Damme, MD Follow up in 1 week(s).   Specialty: Family Medicine Contact information: [address] [place] [address] [phone]        Willis Modena, MD. Call in 1 week(s).   Specialty: Gastroenterology Contact information: [address] [place] [address] [phone]           Time coordinating discharge: 39 minutes  Signed:  Rebekah Chesterfield Jorian Willhoite  [facility]  09/14/2019, 10:05 AM

## 2019-09-15 LAB — CULTURE, BLOOD (ROUTINE X 2)
Culture: NO GROWTH
Culture: NO GROWTH
Special Requests: ADEQUATE
Special Requests: ADEQUATE

## 2020-01-17 IMAGING — CT CT PARANASAL SINUSES LIMITED
3 series · 13 of 47 positions shown, 15 images · non-contrast
Comparison: None.

CLINICAL DATA: Sinusitis, malaise

EXAM:
CT PARANASAL SINUS LIMITED WITHOUT CONTRAST
TECHNIQUE: Multidetector CT images of the paranasal sinuses were obtained using
the standard protocol without intravenous contrast.

[Series 3: sinus 3.0 h30s · axial · 0.34mm/px · z∈[-169,-79]mm · 7 of 37 slices shown, 9 images]
[im 4/37  brain]
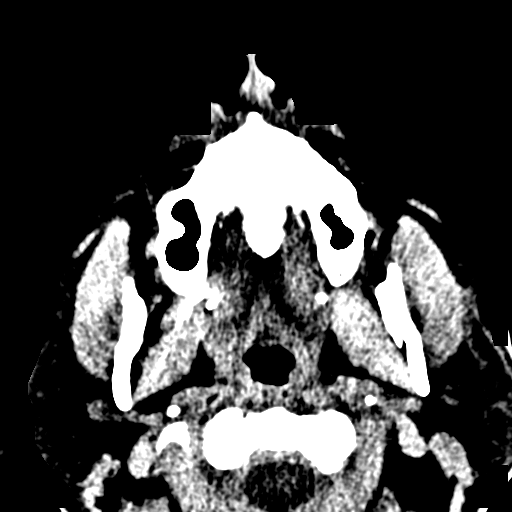
[im 4/37  bone]
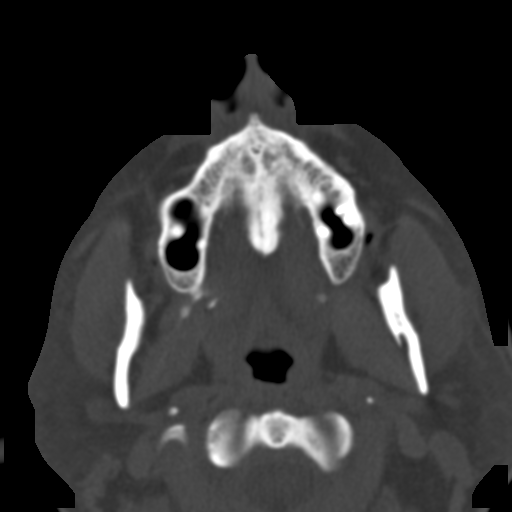
[im 9/37  bone]
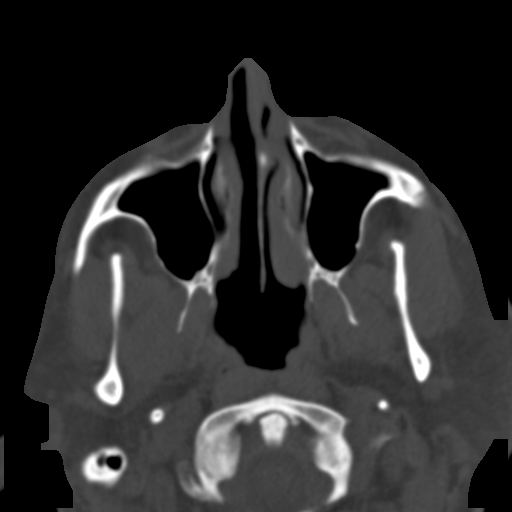
[im 14/37  bone]
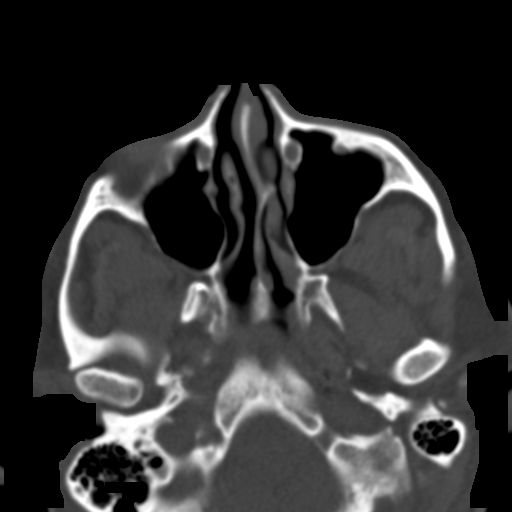
[im 19/37  bone]
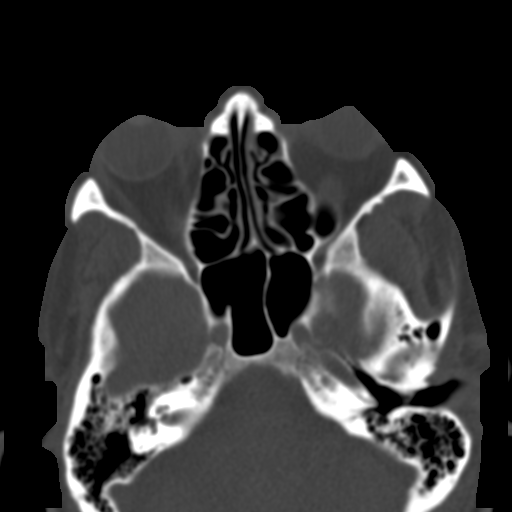
[im 24/37  brain]
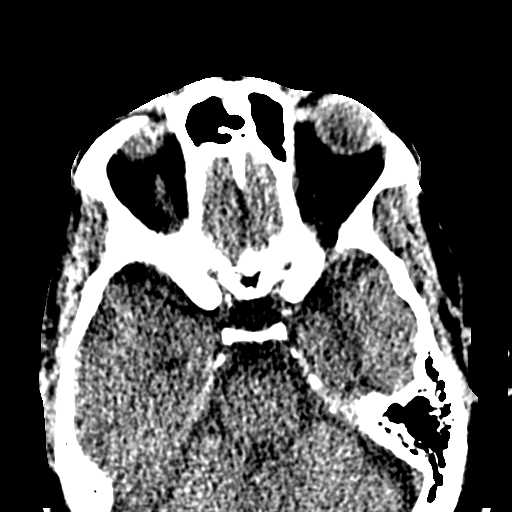
[im 24/37  bone]
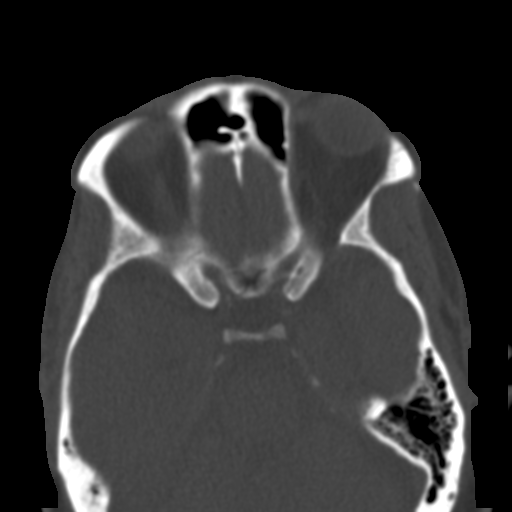
[im 29/37  bone]
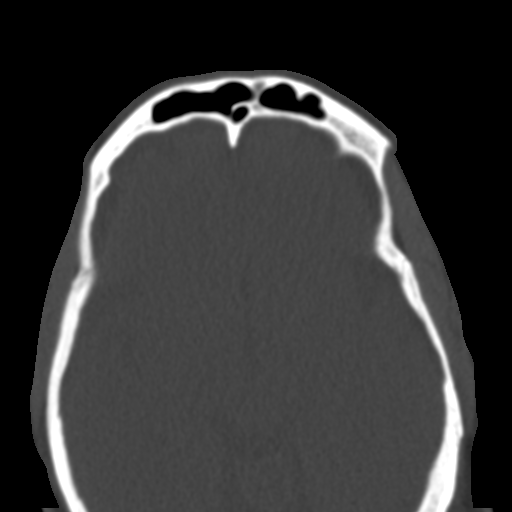
[im 34/37  bone]
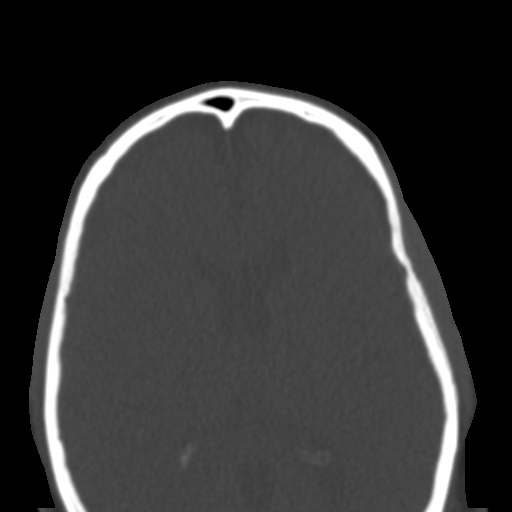

[Series 6: sinus 3.0 mpr cor · coronal · 0.21mm/px · 3 of 54 slices shown]
[im 18/54  bone]
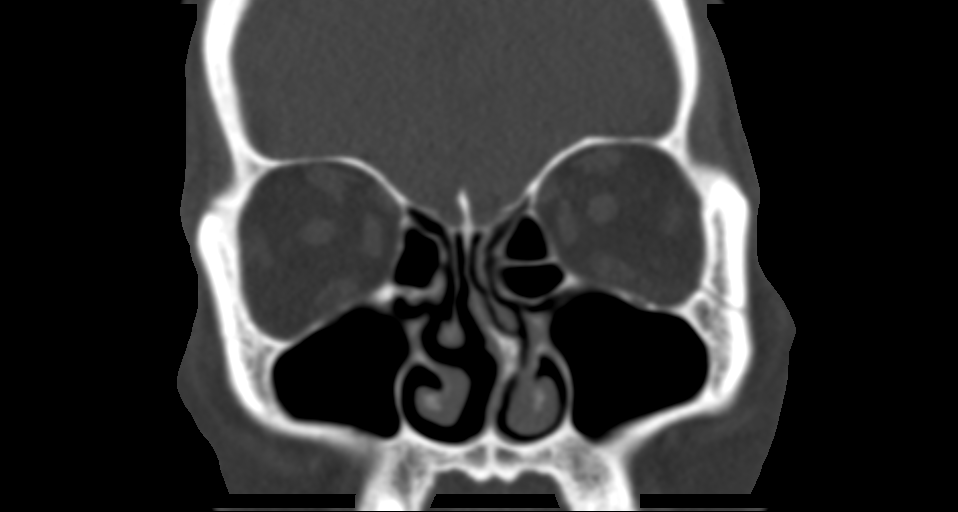
[im 24/54  bone]
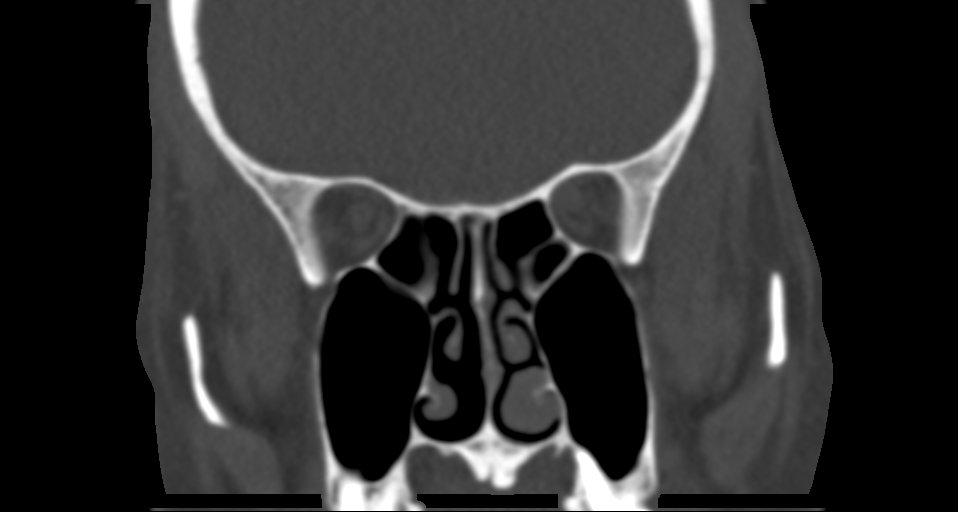
[im 30/54  bone]
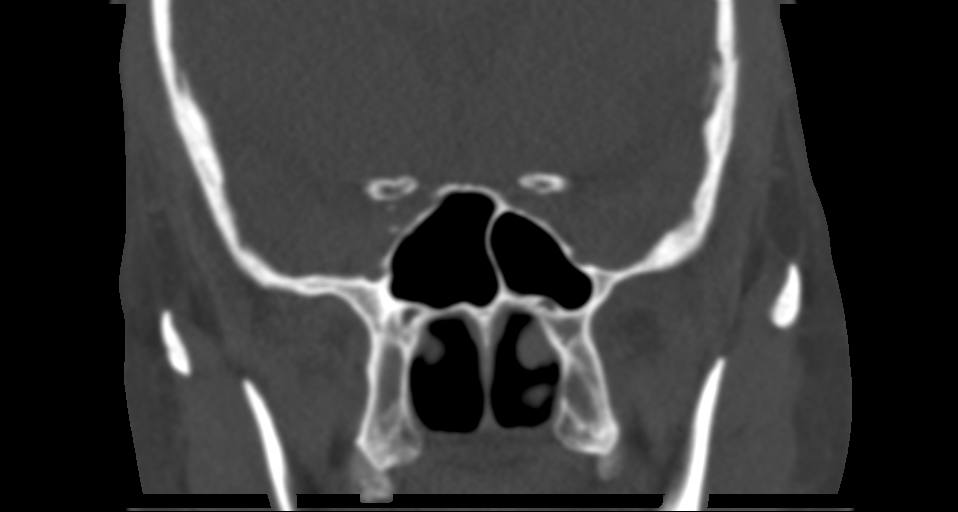

[Series 7: sinus 3.0 mpr sag · sagittal · 0.21mm/px · 3 of 67 slices shown]
[im 23/67  bone]
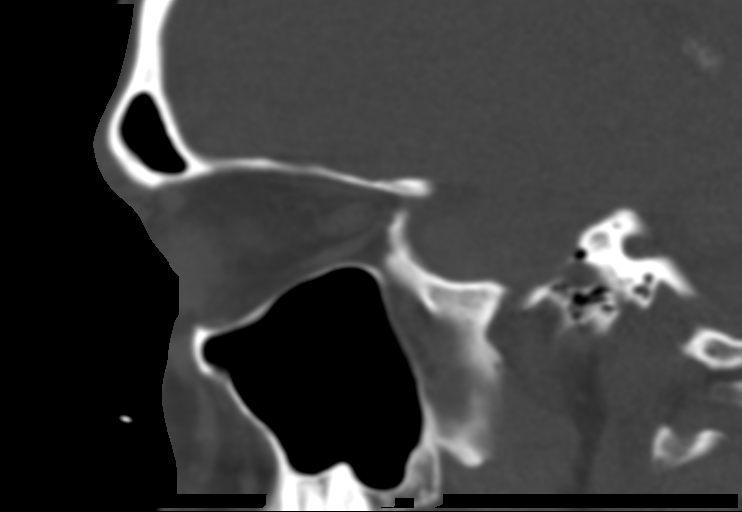
[im 34/67  bone]
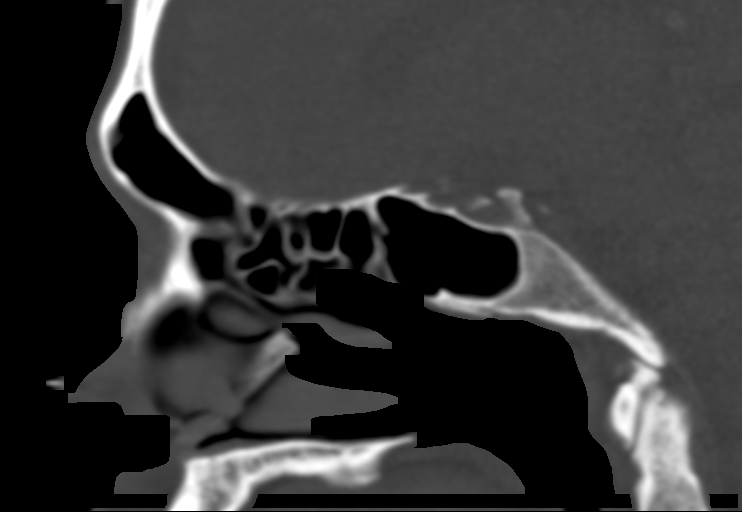
[im 45/67  bone]
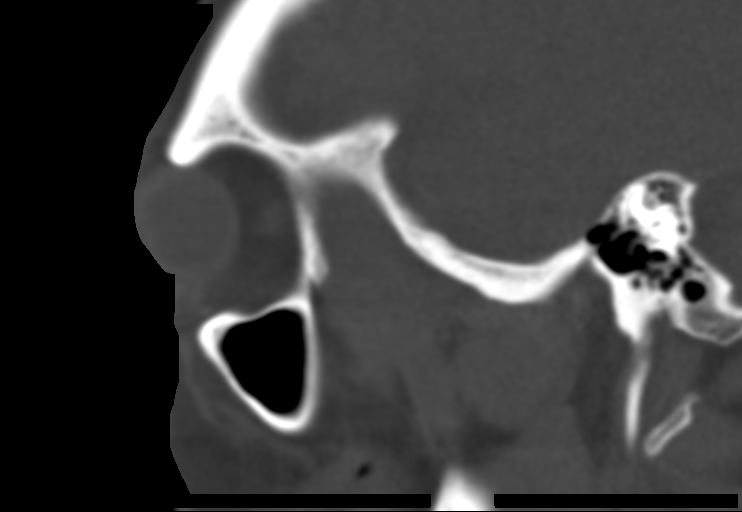

[13 of 47 positions shown; findings below may reference images not displayed]

FINDINGS: Paranasal sinuses:

Frontal: Normally aerated. Patent frontal sinus drainage pathways.

Ethmoid: Normally aerated.

Maxillary: Normally aerated.

Sphenoid: Normally aerated. Patent sphenoethmoidal recesses.
Sphenoid sinus septum inserts eccentric Flock into the left lateral
wall of the sphenoid sinus near the carotid canal.

Right ostiomeatal unit: Patent.

Left ostiomeatal unit: Patent.

Nasal passages: Patent. Asymmetric mucosal fullness the left
turbinates likely related to the normal nasal cycle.Leftward nasal
septal deviation with a contacting nasal septal spur.

Anatomy: No pneumatization superior to anterior ethmoid notches.
Symmetric and intact olfactory grooves and fovea ethmoidalis, Keros
I (1-3mm). Sellar sphenoid pneumatization pattern. No dehiscence of
carotid or optic canals. No onodi cell.

Other: Orbits and intracranial compartment are unremarkable. Visible
mastoid air cells are normally aerated. There is extensive
pneumatization of the petrous apices. Middle ear cavities are clear.
There is debris within both external auditory canals.
IMPRESSION: Normally aerated paranasal sinuses. Patent sinus drainage pathways.

Debris in the external auditory canals, likely cerumen impaction.

Leftward nasal septal deviation with a contacting left nasal septal
spur.

Pneumatization of the petrous apices.

## 2022-01-16 ENCOUNTER — Emergency Department (HOSPITAL_BASED_OUTPATIENT_CLINIC_OR_DEPARTMENT_OTHER): Payer: Medicare Other

## 2022-01-16 ENCOUNTER — Emergency Department (HOSPITAL_BASED_OUTPATIENT_CLINIC_OR_DEPARTMENT_OTHER)
Admission: EM | Admit: 2022-01-16 | Discharge: 2022-01-16 | Disposition: A | Discharge status: Home / Self Care | Payer: Medicare Other | Attending: Emergency Medicine | Admitting: Emergency Medicine

## 2022-01-16 ENCOUNTER — Other Ambulatory Visit: Payer: Self-pay

## 2022-01-16 ENCOUNTER — Encounter (HOSPITAL_BASED_OUTPATIENT_CLINIC_OR_DEPARTMENT_OTHER): Payer: Self-pay | Admitting: *Deleted

## 2022-01-16 DIAGNOSIS — Z7984 Long term (current) use of oral hypoglycemic drugs: Secondary | ICD-10-CM | POA: Diagnosis not present

## 2022-01-16 DIAGNOSIS — Z79899 Other long term (current) drug therapy: Secondary | ICD-10-CM | POA: Diagnosis not present

## 2022-01-16 DIAGNOSIS — S39012A Strain of muscle, fascia and tendon of lower back, initial encounter: Secondary | ICD-10-CM | POA: Insufficient documentation

## 2022-01-16 DIAGNOSIS — S3992XA Unspecified injury of lower back, initial encounter: Secondary | ICD-10-CM | POA: Diagnosis present

## 2022-01-16 DIAGNOSIS — I700 Atherosclerosis of aorta: Secondary | ICD-10-CM | POA: Diagnosis not present

## 2022-01-16 DIAGNOSIS — R1031 Right lower quadrant pain: Secondary | ICD-10-CM | POA: Insufficient documentation

## 2022-01-16 DIAGNOSIS — I10 Essential (primary) hypertension: Secondary | ICD-10-CM | POA: Insufficient documentation

## 2022-01-16 DIAGNOSIS — X58XXXA Exposure to other specified factors, initial encounter: Secondary | ICD-10-CM | POA: Insufficient documentation

## 2022-01-16 DIAGNOSIS — E119 Type 2 diabetes mellitus without complications: Secondary | ICD-10-CM | POA: Diagnosis not present

## 2022-01-16 LAB — CBC WITH DIFFERENTIAL/PLATELET
Abs Immature Granulocytes: 0 10*3/uL (ref 0.00–0.07)
Basophils Absolute: 0 10*3/uL (ref 0.0–0.1)
Basophils Relative: 0 %
Eosinophils Absolute: 0.1 10*3/uL (ref 0.0–0.5)
Eosinophils Relative: 2 %
HCT: 38.5 % — ABNORMAL LOW (ref 39.0–52.0)
Hemoglobin: 13 g/dL (ref 13.0–17.0)
Immature Granulocytes: 0 %
Lymphocytes Relative: 32 %
Lymphs Abs: 1.8 10*3/uL (ref 0.7–4.0)
MCH: 30 pg (ref 26.0–34.0)
MCHC: 33.8 g/dL (ref 30.0–36.0)
MCV: 88.9 fL (ref 80.0–100.0)
Monocytes Absolute: 0.6 10*3/uL (ref 0.1–1.0)
Monocytes Relative: 10 %
Neutro Abs: 3.2 10*3/uL (ref 1.7–7.7)
Neutrophils Relative %: 56 %
Platelets: 181 10*3/uL (ref 150–400)
RBC: 4.33 MIL/uL (ref 4.22–5.81)
RDW: 14.7 % (ref 11.5–15.5)
WBC: 5.8 10*3/uL (ref 4.0–10.5)
nRBC: 0 % (ref 0.0–0.2)

## 2022-01-16 LAB — COMPREHENSIVE METABOLIC PANEL
ALT: 30 U/L (ref 0–44)
AST: 33 U/L (ref 15–41)
Albumin: 4.1 g/dL (ref 3.5–5.0)
Alkaline Phosphatase: 58 U/L (ref 38–126)
Anion gap: 8 (ref 5–15)
BUN: 13 mg/dL (ref 8–23)
CO2: 27 mmol/L (ref 22–32)
Calcium: 9.5 mg/dL (ref 8.9–10.3)
Chloride: 101 mmol/L (ref 98–111)
Creatinine, Ser: 0.86 mg/dL (ref 0.61–1.24)
GFR, Estimated: >60 mL/min (ref >60)
Glucose, Bld: 132 mg/dL — ABNORMAL HIGH (ref 70–99)
Potassium: 4.4 mmol/L (ref 3.5–5.1)
Sodium: 136 mmol/L (ref 135–145)
Total Bilirubin: 0.8 mg/dL (ref 0.3–1.2)
Total Protein: 6.7 g/dL (ref 6.5–8.1)

## 2022-01-16 LAB — URINALYSIS, ROUTINE W REFLEX MICROSCOPIC
Bilirubin Urine: NEGATIVE
Glucose, UA: NEGATIVE mg/dL
Hgb urine dipstick: NEGATIVE
Ketones, ur: NEGATIVE mg/dL
Leukocytes,Ua: NEGATIVE
Nitrite: NEGATIVE
Protein, ur: NEGATIVE mg/dL
Specific Gravity, Urine: 1.015 (ref 1.005–1.030)
pH: 5 (ref 5.0–8.0)

## 2022-01-16 MED ORDER — METHOCARBAMOL 500 MG PO TABS: 500.0000 mg | ORAL_TABLET | Freq: Two times a day (BID) | ORAL | 0 refills | Status: DC

## 2022-01-16 MED ORDER — LIDOCAINE 5 % EX PTCH: 1.0000 | MEDICATED_PATCH | CUTANEOUS | 0 refills | Status: AC

## 2022-01-16 NOTE — ED Provider Notes (Signed)
[place] EMERGENCY DEPARTMENT Provider Note   CSN: [id number] Arrival date & time: 01/16/22  1329     History  Chief Complaint  Patient presents with   Flank Pain    Arthur Spencer is a 76 y.o. male with a past medical history of diabetes, hypertension presenting to the ED with a chief complaint of right-sided flank pain.  He has had intermittent right-sided lower back pain for the past 1.5 months.  Describes it as sharp, intermittent without specific aggravating or alleviating factor.  The pain has become more frequently recently which prompted his visit to the ED.  He has not tried medications to help with symptoms.  He denies any vomiting, nausea, fever, dysuria or gross hematuria.  He denies history of kidney stones.  He denies any chest pain, abdominal pain, shortness of breath, recent injury, or recent heavy lifting.  Flank Pain Pertinent negatives include no chest pain, no abdominal pain and no shortness of breath.      Home Medications Prior to Admission medications   Medication Sig Start Date End Date Taking? Authorizing Provider  lidocaine (LIDODERM) 5 % Place 1 patch onto the skin daily. Remove & Discard patch within 12 hours or as directed by MD 01/16/22  Yes Dmauri Rosenow, PA-C  methocarbamol (ROBAXIN) 500 MG tablet Take 1 tablet (500 mg total) by mouth 2 (two) times daily. 01/16/22  Yes Leilyn Frayre, PA-C  APPLE CIDER VINEGAR PO Take 1 capsule by mouth daily.    [provider]  Ascorbic Acid (VITAMIN C) 100 MG tablet Take 100 mg by mouth daily.    [provider]  atorvastatin (LIPITOR) 40 MG tablet Take 40 mg by mouth at bedtime. 06/24/19   [provider]  Chromium 1 MG CAPS Take 1 mg by mouth daily.    [provider]  Cinnamon 500 MG capsule Take 500 mg by mouth daily.    [provider]  ezetimibe (ZETIA) 10 MG tablet Take 10 mg by mouth daily.    [provider]  hydrochlorothiazide (HYDRODIURIL) 25 MG  tablet Take 25 mg by mouth daily.     [provider]  lisinopril (ZESTRIL) 5 MG tablet Take 5 mg by mouth daily.  02/19/19   [provider]  metFORMIN (GLUCOPHAGE) 500 MG tablet Take 500 mg by mouth 2 (two) times daily with a meal.     [provider]  niacin 100 MG tablet Take 100 mg by mouth at bedtime.    [provider]  Omega-3 Fatty Acids (FISH OIL) 1000 MG CAPS Take 1 capsule by mouth daily.    [provider]  polyethylene glycol (MIRALAX / GLYCOLAX) 17 g packet Take 17 g by mouth 2 (two) times daily. 09/14/19   Pokhrel, Corrie Mckusick, MD  saw palmetto 160 MG capsule Take 160 mg by mouth daily.    [provider]      Allergies    Patient has no known allergies.    Review of Systems   Review of Systems  Constitutional:  Negative for appetite change, chills and fever.  HENT:  Negative for ear pain, rhinorrhea, sneezing and sore throat.   Eyes:  Negative for photophobia and visual disturbance.  Respiratory:  Negative for cough, chest tightness, shortness of breath and wheezing.   Cardiovascular:  Negative for chest pain and palpitations.  Gastrointestinal:  Negative for abdominal pain, blood in stool, constipation, diarrhea, nausea and vomiting.  Genitourinary:  Positive for flank pain. Negative for  dysuria, hematuria and urgency.  Musculoskeletal:  Negative for myalgias.  Skin:  Negative for rash.  Neurological:  Negative for dizziness, weakness and light-headedness.   Physical Exam Updated Vital Signs BP 122/73 (BP Location: Right Arm)    Pulse (!) 51    Temp 98.1 F (36.7 C) (Oral)    Resp 16    Ht 6\' 1"  (1.854 m)    Wt 102.1 kg    SpO2 98%    BMI 29.69 kg/m  Physical Exam Vitals and nursing note reviewed.  Constitutional:      General: He is not in acute distress.    Appearance: He is well-developed.  HENT:     Head: Normocephalic and atraumatic.     Nose: Nose normal.  Eyes:     General: No scleral icterus.       Left  eye: No discharge.     Conjunctiva/sclera: Conjunctivae normal.  Cardiovascular:     Rate and Rhythm: Normal rate and regular rhythm.     Heart sounds: Normal heart sounds. No murmur heard.   No friction rub. No gallop.  Pulmonary:     Effort: Pulmonary effort is normal. No respiratory distress.     Breath sounds: Normal breath sounds.  Abdominal:     General: Bowel sounds are normal. There is no distension.     Palpations: Abdomen is soft.     Tenderness: There is no abdominal tenderness. There is no guarding.  Musculoskeletal:        General: Tenderness present. Normal range of motion.     Cervical back: Normal range of motion and neck supple.       Back:  Skin:    General: Skin is warm and dry.     Findings: No rash.  Neurological:     Mental Status: He is alert.     Motor: No abnormal muscle tone.     Coordination: Coordination normal.    ED Results / Procedures / Treatments   Labs (all labs ordered are listed, but only abnormal results are displayed) Labs Reviewed  URINALYSIS, ROUTINE W REFLEX MICROSCOPIC - Abnormal; Notable for the following components:      Result Value   Color, Urine STRAW (*)    All other components within normal limits  COMPREHENSIVE METABOLIC PANEL - Abnormal; Notable for the following components:   Glucose, Bld 132 (*)    All other components within normal limits  CBC WITH DIFFERENTIAL/PLATELET - Abnormal; Notable for the following components:   HCT 38.5 (*)    All other components within normal limits    EKG None  Radiology CT Renal Stone Study  Result Date: 01/16/2022 CLINICAL DATA:  Flank pain. Kidney stone suspected. Intermittent right flank pain for 2 months. EXAM: CT ABDOMEN AND PELVIS WITHOUT CONTRAST TECHNIQUE: Multidetector CT imaging of the abdomen and pelvis was performed following the standard protocol without IV contrast. RADIATION DOSE REDUCTION: This exam was performed according to the departmental dose-optimization program  which includes automated exposure control, adjustment of the mA and/or kV according to patient size and/or use of iterative reconstruction technique. COMPARISON:  Abdominopelvic CT 09/11/2019. FINDINGS: Lower chest: Clear lung bases. No significant pleural or pericardial effusion. Hepatobiliary: The liver appears unremarkable as imaged in the noncontrast state. No evidence of gallstones, gallbladder wall thickening or biliary dilatation. Pancreas: Unremarkable. No pancreatic ductal dilatation or surrounding inflammatory changes. Spleen: Normal in size without focal abnormality. Adrenals/Urinary Tract: Right adrenal calcifications are again noted, unchanged from the previous study  and consistent with prior hemorrhage or infection. The left adrenal gland appears normal. As evaluated in the noncontrast state, both kidneys appear normal. No evidence of urinary tract calculus or hydronephrosis. The bladder is mildly distended without focal abnormality. Stomach/Bowel: No enteric contrast was administered. The stomach appears unremarkable for its degree of distension. No evidence of bowel wall thickening, distention or surrounding inflammatory change. The previously demonstrated right lower quadrant inflammatory changes have resolved. The appendix is not seen with certainty. There is prominent stool throughout the colon with mild sigmoid diverticulosis. Vascular/Lymphatic: There are no enlarged abdominal or pelvic lymph nodes. Mild aortic and branch vessel atherosclerosis. Reproductive: The prostate gland and seminal vesicles appear unremarkable. Other: No evidence of abdominal wall mass or hernia. No ascites. Musculoskeletal: No acute or significant osseous findings. Multilevel lumbar spondylosis with osseous foraminal narrowing greatest on the left at L5-S1. IMPRESSION: 1. No acute findings or explanation for the patient's symptoms. No evidence of urinary tract calculus or hydronephrosis. 2. The previously demonstrated  inflammatory changes in the right lower quadrant have resolved. 3. Stable chronic right adrenal calcifications. 4.  Aortic Atherosclerosis (ICD10-I70.0). Electronically Signed   By: Richardean Sale M.D.   On: 01/16/2022 15:15    Procedures Procedures    Medications Ordered in ED Medications - No data to display  ED Course/ Medical Decision Making/ A&P Clinical Course as of 01/16/22 1646  Fri Jan 16, 2022  1419 Urinalysis, Routine w reflex microscopic Urine, Clean Catch(!) Negative. [HK]    Clinical Course User Index [HK] Delia Heady, PA-C                           Medical Decision Making Amount and/or Complexity of Data Reviewed Labs: ordered. Decision-making details documented in ED Course. Radiology: ordered.  Risk Prescription drug management.   76 year old male with past medical history of diabetes, hypertension presenting to the ED with complaint of right-sided flank pain.  Intermittent right lower back pain for the past 1-1/2 months.  Describes it as sharp.  No urinary symptoms.  No chest pain, abdominal pain, shortness of breath or recent injury.  On exam he has some tenderness of the right lower back.  No weakness noted.  Abdomen is soft.  Lungs are clear bilaterally.  Vital signs are within normal limits.  Urinalysis, CBC, CMP are unremarkable.  CT renal stone study shows no acute findings, no hydronephrosis or concern for stone or clarifies any abnormalities. Suspect symptoms with acute skeletal nature.  Doubt pyelonephritis, cauda equina, dissection or other emergent process. Will treat with muscle relaxer and lidocaine patch. Return precautions given.   Patient is hemodynamically stable, in NAD, and able to ambulate in the ED. Evaluation does not show pathology that would require ongoing emergent intervention or inpatient treatment. I explained the diagnosis to the patient. Pain has been managed and has no complaints prior to discharge. Patient is comfortable with above  plan and is stable for discharge at this time. All questions were answered prior to disposition. Strict return precautions for returning to the ED were discussed. Encouraged follow up with PCP.   An After Visit Summary was printed and given to the patient.   Portions of this note were generated with [employer]. Dictation errors may occur despite best attempts at proofreading.         Final Clinical Impression(s) / ED Diagnoses Final diagnoses:  Strain of lumbar region, initial encounter    Rx / DC  Orders ED Discharge Orders          Ordered    lidocaine (LIDODERM) 5 %  Every 24 hours        01/16/22 1642    methocarbamol (ROBAXIN) 500 MG tablet  2 times daily        01/16/22 1642              Delia Heady, PA-C 01/16/22 1646    Horton, Alvin Critchley, DO 01/16/22 2315

## 2022-01-16 NOTE — ED Notes (Signed)
Discharge instructions discussed with pt. Pt verbalized understanding. Pt stable and ambulatory.  °

## 2022-01-16 NOTE — Discharge Instructions (Addendum)
Take the medications as prescribed as needed. Take Tylenol to help with pain as well. Follow-up with your primary care provider. Return to the ER if you start to experience worsening pain, burning with urination, blood in your urine, fever or chest pain

## 2022-01-16 NOTE — ED Triage Notes (Signed)
Right flank pain on and off x 2 months.

## 2022-03-23 ENCOUNTER — Encounter (HOSPITAL_BASED_OUTPATIENT_CLINIC_OR_DEPARTMENT_OTHER): Payer: Self-pay | Admitting: Emergency Medicine

## 2022-03-23 ENCOUNTER — Emergency Department (HOSPITAL_BASED_OUTPATIENT_CLINIC_OR_DEPARTMENT_OTHER): Payer: Medicare Other

## 2022-03-23 ENCOUNTER — Emergency Department (HOSPITAL_BASED_OUTPATIENT_CLINIC_OR_DEPARTMENT_OTHER)
Admission: EM | Admit: 2022-03-23 | Discharge: 2022-03-23 | Disposition: A | Discharge status: Home / Self Care | Payer: Medicare Other | Attending: Emergency Medicine | Admitting: Emergency Medicine

## 2022-03-23 ENCOUNTER — Other Ambulatory Visit: Payer: Self-pay

## 2022-03-23 DIAGNOSIS — Z7984 Long term (current) use of oral hypoglycemic drugs: Secondary | ICD-10-CM | POA: Insufficient documentation

## 2022-03-23 DIAGNOSIS — J209 Acute bronchitis, unspecified: Secondary | ICD-10-CM | POA: Diagnosis not present

## 2022-03-23 DIAGNOSIS — Z79899 Other long term (current) drug therapy: Secondary | ICD-10-CM | POA: Insufficient documentation

## 2022-03-23 DIAGNOSIS — I10 Essential (primary) hypertension: Secondary | ICD-10-CM | POA: Insufficient documentation

## 2022-03-23 DIAGNOSIS — Z20822 Contact with and (suspected) exposure to covid-19: Secondary | ICD-10-CM | POA: Insufficient documentation

## 2022-03-23 DIAGNOSIS — R059 Cough, unspecified: Secondary | ICD-10-CM | POA: Diagnosis present

## 2022-03-23 DIAGNOSIS — E119 Type 2 diabetes mellitus without complications: Secondary | ICD-10-CM | POA: Diagnosis not present

## 2022-03-23 DIAGNOSIS — J0110 Acute frontal sinusitis, unspecified: Secondary | ICD-10-CM | POA: Diagnosis not present

## 2022-03-23 LAB — RESP PANEL BY RT-PCR (FLU A&B, COVID) ARPGX2
Influenza A by PCR: NEGATIVE
Influenza B by PCR: NEGATIVE
SARS Coronavirus 2 by RT PCR: NEGATIVE

## 2022-03-23 MED ORDER — DOXYCYCLINE HYCLATE 100 MG PO CAPS: 100.0000 mg | ORAL_CAPSULE | Freq: Two times a day (BID) | ORAL | 0 refills | Status: AC

## 2022-03-23 MED ORDER — BENZONATATE 100 MG PO CAPS: 100.0000 mg | ORAL_CAPSULE | Freq: Three times a day (TID) | ORAL | 0 refills | Status: DC

## 2022-03-23 MED ORDER — IPRATROPIUM-ALBUTEROL 0.5-2.5 (3) MG/3ML IN SOLN
3.0000 mL | Freq: Once | RESPIRATORY_TRACT | Status: AC
  Administered 2022-03-23: 3 mL via RESPIRATORY_TRACT
  Filled 2022-03-23: qty 3

## 2022-03-23 MED ORDER — ALBUTEROL SULFATE HFA 108 (90 BASE) MCG/ACT IN AERS: 1.0000 | INHALATION_SPRAY | Freq: Four times a day (QID) | RESPIRATORY_TRACT | 0 refills | Status: AC | PRN

## 2022-03-23 NOTE — ED Notes (Signed)
ED Provider at bedside. 

## 2022-03-23 NOTE — ED Provider Notes (Signed)
?[facility] ?Provider Note ? ? ?CSN: [id number] ?Arrival date & time: 03/23/22  1423 ? ?  ? ?History ? ?Chief Complaint  ?Patient presents with  ? Cough  ? ? ?Arthur Spencer is a 76 y.o. male with history of hypertension, diabetes who presents to the ED for evaluation of worsening productive cough and sinus pain with green/yellow expectorated mucus.  Symptoms started 10 days ago and has been progressively worsening.  Taking Robitussin, Mucinex, Tylenol Cold and flu and numerous over-the-counter medications without significant improvement.  Patient notes that he does have a history of sinus infections and feels like this is similar, however he is also feeling pain in his chest described as burning and is concerned that he has pneumonia.  He denies fever, difficulty breathing, chills, nausea, vomiting and diarrhea.   ? ? ?Cough ? ?  ? ?Home Medications ?Prior to Admission medications   ?Medication Sig Start Date End Date Taking? Authorizing Provider  ?albuterol (VENTOLIN HFA) 108 (90 Base) MCG/ACT inhaler Inhale 1-2 puffs into the lungs every 6 (six) hours as needed for wheezing or shortness of breath. 03/23/22  Yes Kathe Becton R, PA-C  ?benzonatate (TESSALON) 100 MG capsule Take 1 capsule (100 mg total) by mouth every 8 (eight) hours. 03/23/22  Yes Kathe Becton R, PA-C  ?doxycycline (VIBRAMYCIN) 100 MG capsule Take 1 capsule (100 mg total) by mouth 2 (two) times daily for 5 days. 03/23/22 03/28/22 Yes Tonye Pearson, PA-C  ?APPLE CIDER VINEGAR PO Take 1 capsule by mouth daily.    [provider]  ?Ascorbic Acid (VITAMIN C) 100 MG tablet Take 100 mg by mouth daily.    [provider]  ?atorvastatin (LIPITOR) 40 MG tablet Take 40 mg by mouth at bedtime. 06/24/19   [provider]  ?Chromium 1 MG CAPS Take 1 mg by mouth daily.    [provider]  ?Cinnamon 500 MG capsule Take 500 mg by mouth daily.    [provider]  ?ezetimibe (ZETIA) 10 MG  tablet Take 10 mg by mouth daily.    [provider]  ?hydrochlorothiazide (HYDRODIURIL) 25 MG tablet Take 25 mg by mouth daily.     [provider]  ?lidocaine (LIDODERM) 5 % Place 1 patch onto the skin daily. Remove & Discard patch within 12 hours or as directed by MD 01/16/22   Delia Heady, PA-C  ?lisinopril (ZESTRIL) 5 MG tablet Take 5 mg by mouth daily.  02/19/19   [provider]  ?metFORMIN (GLUCOPHAGE) 500 MG tablet Take 500 mg by mouth 2 (two) times daily with a meal.     [provider]  ?methocarbamol (ROBAXIN) 500 MG tablet Take 1 tablet (500 mg total) by mouth 2 (two) times daily. 01/16/22   Khatri, Hina, PA-C  ?niacin 100 MG tablet Take 100 mg by mouth at bedtime.    [provider]  ?Omega-3 Fatty Acids (FISH OIL) 1000 MG CAPS Take 1 capsule by mouth daily.    [provider]  ?polyethylene glycol (MIRALAX / GLYCOLAX) 17 g packet Take 17 g by mouth 2 (two) times daily. 09/14/19   Pokhrel, Corrie Mckusick, MD  ?saw palmetto 160 MG capsule Take 160 mg by mouth daily.    [provider]  ?   ? ?Allergies    ?Patient has no known allergies.   ? ?Review of Systems   ?Review of Systems  ?Respiratory:  Positive for cough.   ? ?Physical Exam ?Updated Vital Signs ?BP  138/67 (BP Location: Right Arm)   Pulse (!) 54   Temp 98.6 ?F (37 ?C) (Oral)   Resp 20   Ht 6\' 1"  (1.854 m)   Wt 102.1 kg   SpO2 100%   BMI 29.69 kg/m?  ?Physical Exam ?Vitals and nursing note reviewed.  ?Constitutional:   ?   General: He is not in acute distress. ?   Appearance: He is not ill-appearing.  ?HENT:  ?   Head: Atraumatic.  ?   Nose:  ?   Right Turbinates: Swollen.  ?   Left Turbinates: Swollen.  ?   Right Sinus: Frontal sinus tenderness present. No maxillary sinus tenderness.  ?   Left Sinus: Frontal sinus tenderness present. No maxillary sinus tenderness.  ?Eyes:  ?   Conjunctiva/sclera: Conjunctivae normal.  ?Cardiovascular:  ?   Rate and Rhythm: Normal rate and regular rhythm.   ?   Pulses: Normal pulses.  ?   Heart sounds: No murmur heard. ?Pulmonary:  ?   Effort: Pulmonary effort is normal. No respiratory distress.  ?   Breath sounds: Normal breath sounds.  ?   Comments: Wheezing in the left upper and mid lobe.  Negative for rales and rhonchi ?Abdominal:  ?   General: Abdomen is flat. There is no distension.  ?   Palpations: Abdomen is soft.  ?   Tenderness: There is no abdominal tenderness.  ?Musculoskeletal:     ?   General: Normal range of motion.  ?   Cervical back: Normal range of motion.  ?Skin: ?   General: Skin is warm and dry.  ?   Capillary Refill: Capillary refill takes less than 2 seconds.  ?Neurological:  ?   General: No focal deficit present.  ?   Mental Status: He is alert.  ?Psychiatric:     ?   Mood and Affect: Mood normal.  ? ? ?ED Results / Procedures / Treatments   ?Labs ?(all labs ordered are listed, but only abnormal results are displayed) ?Labs Reviewed  ?RESP PANEL BY RT-PCR (FLU A&B, COVID) [id number]  ? ? ?EKG ?None ? ?Radiology ?DG Chest 2 View ? ?Result Date: 03/23/2022 ?CLINICAL DATA:  Cough. EXAM: CHEST - 2 VIEW COMPARISON:  Chest x-ray 09/09/2019. FINDINGS: The heart size and mediastinal contours are within normal limits. Both lungs are clear. The visualized skeletal structures are unremarkable. IMPRESSION: No active cardiopulmonary disease. Electronically Signed   By: Ronney Asters M.D.   On: 03/23/2022 16:05   ? ?Procedures ?Procedures  ? ?Medications Ordered in ED ?Medications  ?ipratropium-albuterol (DUONEB) 0.5-2.5 (3) MG/3ML nebulizer solution 3 mL (has no administration in time range)  ? ? ?ED Course/ Medical Decision Making/ A&P ?  ?                        ?Medical Decision Making ?Amount and/or Complexity of Data Reviewed ?Radiology: ordered. ? ?Risk ?Prescription drug management. ? ? ?History:  ?Per HPI ?Social determinants of health: None ? ?Initial impression: ? ?This patient presents to the ED for concern of productive cough and sinus pain, this  involves an extensive number of treatment options, and is a complaint that carries with it a high risk of complications and morbidity.    ? ? ?Lab Tests and EKG: ? ?I Ordered, reviewed, and interpreted labs and EKG.  The pertinent results include:  ?Respiratory panel negative ? ? ?Imaging Studies ordered: ? ?I ordered imaging studies including  ?Chest x-ray without acute  findings ?I independently visualized and interpreted imaging and I agree with the radiologist interpretation.  ? ? ?Cardiac Monitoring: ? ?The patient was maintained on a cardiac monitor.  I personally viewed and interpreted the cardiac monitored which showed an underlying rhythm of: NSR ? ? ?Medicines ordered and prescription drug management: ? ?I ordered medication including: ?DuoNeb ?Reevaluation of the patient after these medicines showed that the patient improved ?I have reviewed the patients home medicines and have made adjustments as needed ? ? ?ED Course: ?76 year old male in no acute distress presents to the ED for evaluation of worsening productive cough and sinus pain x10 days.  Physical exam significant for frontal sinus tenderness and wheezing in the left lung.  Negative for COVID and flu.  Chest x-ray without signs of pneumonia.  Patient given DuoNeb treatment with improvement of wheezing.  Given the duration of his symptoms, previous history of sinusitis and green-yellow mucus, I think antibiotics are appropriate at this time.  Will discharge home with doxycycline as well as Tessalon and albuterol inhaler. ? ?Disposition: ? ?After consideration of the diagnostic results, physical exam, history and the patients response to treatment feel that the patent would benefit from discharge.   ?Acute bronchitis ?Acute frontal sinusitis: Management and plan as described above.  Return precautions were discussed.  All questions were asked and answered he was discharged home in good condition. ? ? ?Final Clinical Impression(s) / ED  Diagnoses ?Final diagnoses:  ?Acute bronchitis, unspecified organism  ?Acute frontal sinusitis, recurrence not specified  ? ? ?Rx / DC Orders ?ED Discharge Orders   ? ?      Ordered  ?  benzonatate (TESSALON) 100 MG capsule  Every

## 2022-03-23 NOTE — ED Triage Notes (Signed)
Pt arrives pov with c/o cough, HA and congestion. Neg covid test at home x 4 days pta. Denies fever, denies shob ?

## 2022-03-23 NOTE — Discharge Instructions (Addendum)
Your COVID and flu test was negative, your chest x-ray was without signs of pneumonia or infection.  However, given the duration of your symptoms and your worsening productive cough along with your history of sinus infections, I have sent you in an antibiotic that you can take twice daily for 5 days.  If you continue to decline, please follow-up with your primary care doctor for reevaluation and if you develop shortness of breath or difficulty breathing, you can return to the ED. ? ?In addition to the antibiotic, I sent you in a prescription for an albuterol inhaler to help with some of the wheezing that I heard on exam today as well as Jerilynn Som which she can take at night to help with cough suppression.  Continue taking Mucinex or Robitussin for decongestion.  I hope you feel better soon ?

## 2022-04-29 ENCOUNTER — Telehealth: Payer: Self-pay | Admitting: Orthopaedic Surgery

## 2022-04-29 NOTE — Telephone Encounter (Signed)
Pt called and has had 2 prior back surgeries at [facility] in [place].. He thinks his doctors name was Warren,Yu. He was a Midwife. The first surgery was in 09 and the second was in 2015. He was told to come see an orthopedic for the numbness in his L leg before he can go to neurosurgeon. ?He is from [place] and was trying to find a doctor in [place]. He states you can call or email at any time. He will be bring MRI and Xrays up here ?[id number] ? ?Email: [email or web address] ?

## 2022-04-30 NOTE — Telephone Encounter (Signed)
Request for records/Op Notes faxed to Dr. Felicita Gage office [phone]

## 2022-04-30 NOTE — Telephone Encounter (Signed)
Patient has medical records he will be leaving with [place] from his prior dr visits will be in the xrays and MRIs are on film

## 2022-05-18 NOTE — Telephone Encounter (Signed)
Patient called in with fax number [phone]. Request for records faxed

## 2022-05-18 NOTE — Telephone Encounter (Signed)
Received call from patient checking if we received his records. IC spoke with pt advised we have not received anything yet. I advised he call and followup on. I told him where I send request he told me I didn't sent to correct facility. I explained how I got his fax and phone,but patient will try to get a new fax number and be back in touch with me.

## 2022-05-24 IMAGING — CT CT RENAL STONE PROTOCOL
2 of 4 series · 16 of 46 positions shown, 18 images · non-contrast
Comparison: Abdominopelvic CT 09/11/2019.

CLINICAL DATA: Flank pain. Kidney stone suspected. Intermittent
right flank pain for 2 months.



[Series 2: axial st · axial · 0.96mm/px · z∈[-360,+40]mm · 13 of 88 slices shown, 15 images]
[im 4/88  soft-tissue]
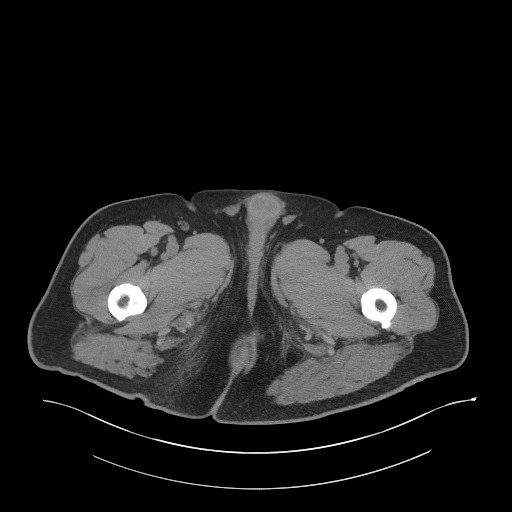
[im 4/88  bone]
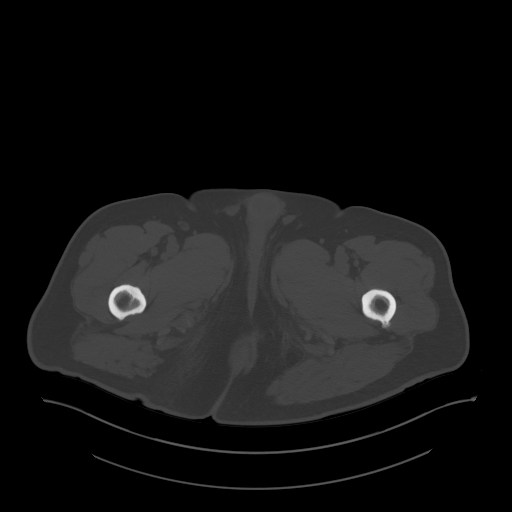
[im 11/88  soft-tissue]
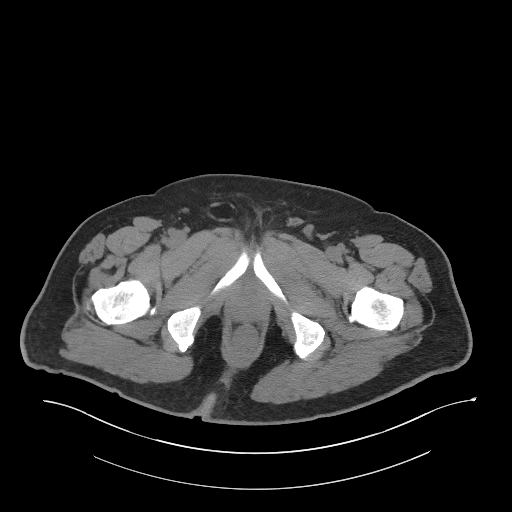
[im 17/88  soft-tissue]
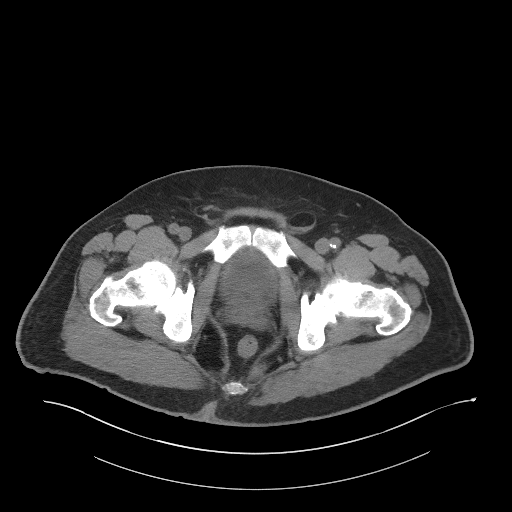
[im 24/88  soft-tissue]
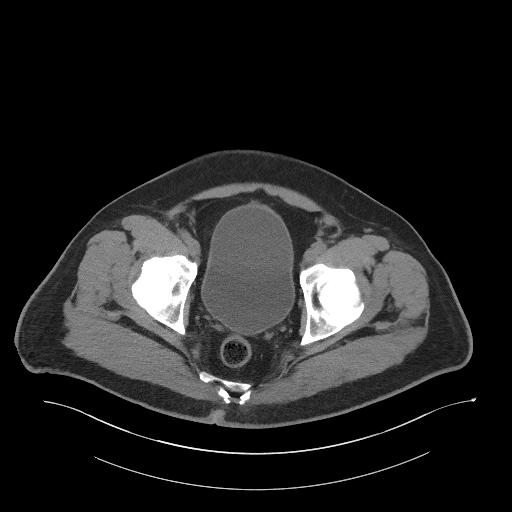
[im 31/88  soft-tissue]
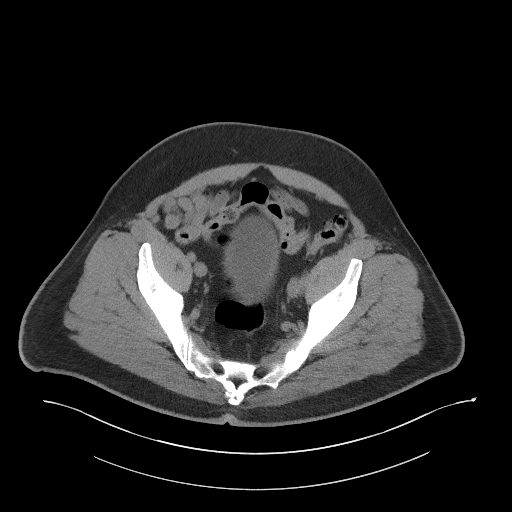
[im 37/88  soft-tissue]
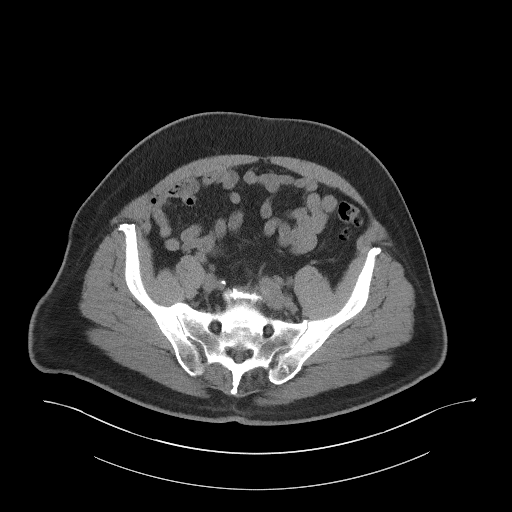
[im 44/88  soft-tissue]
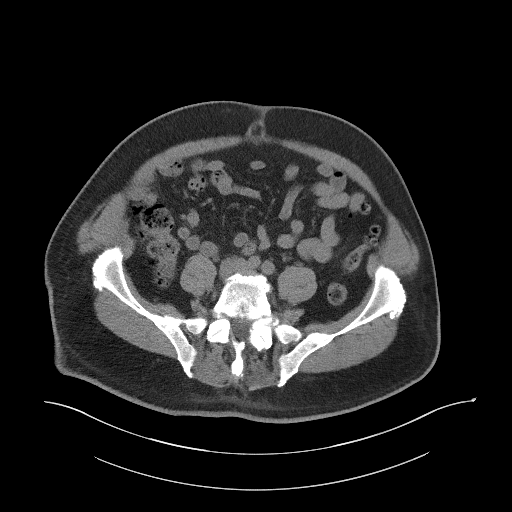
[im 51/88  soft-tissue]
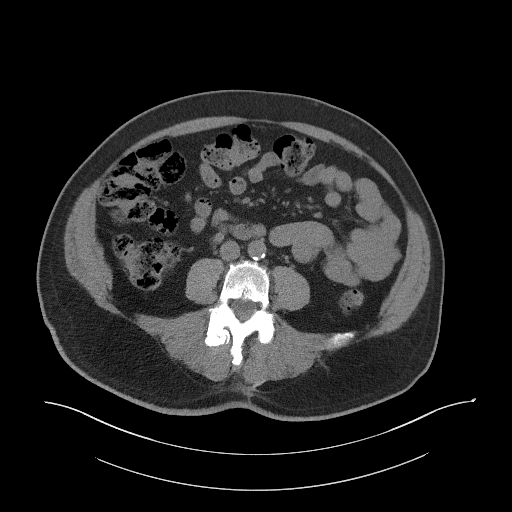
[im 57/88  soft-tissue]
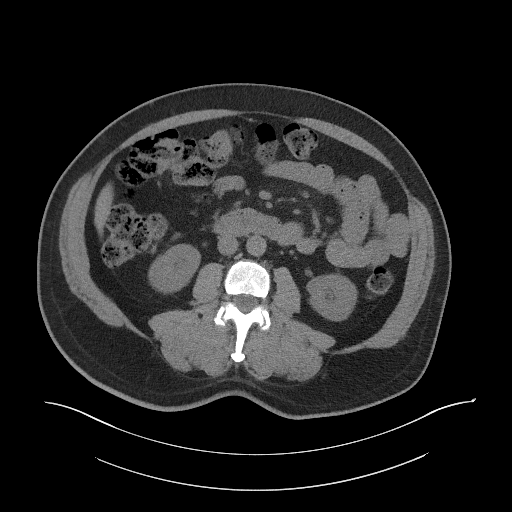
[im 57/88  bone]
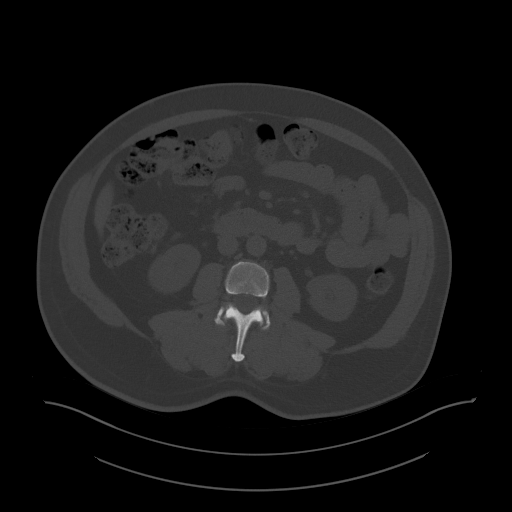
[im 64/88  soft-tissue]
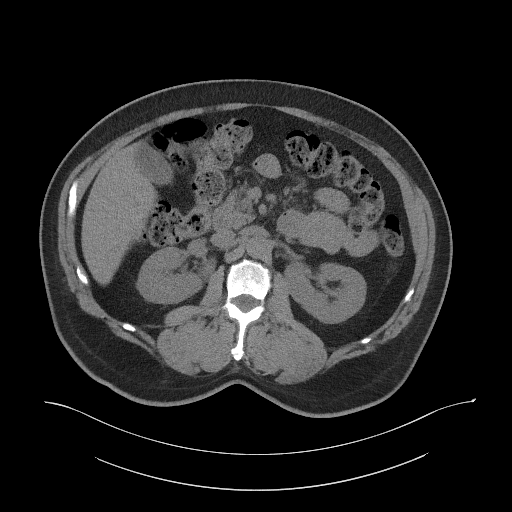
[im 71/88  soft-tissue]
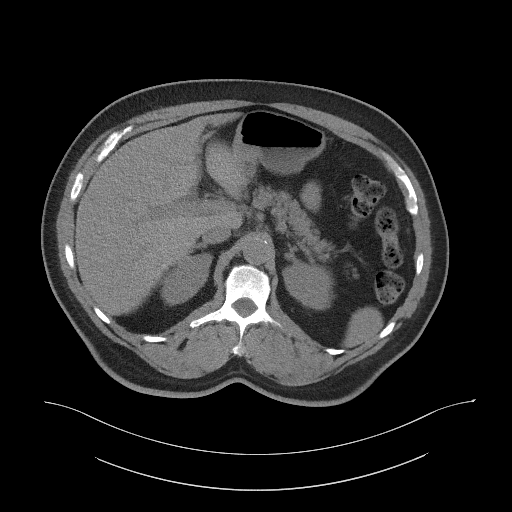
[im 77/88  soft-tissue]
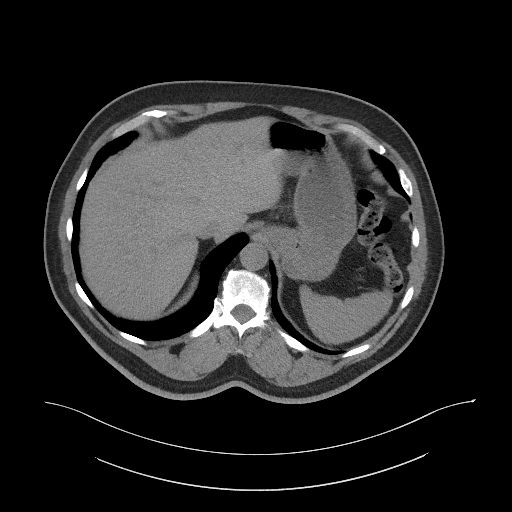
[im 84/88  soft-tissue]
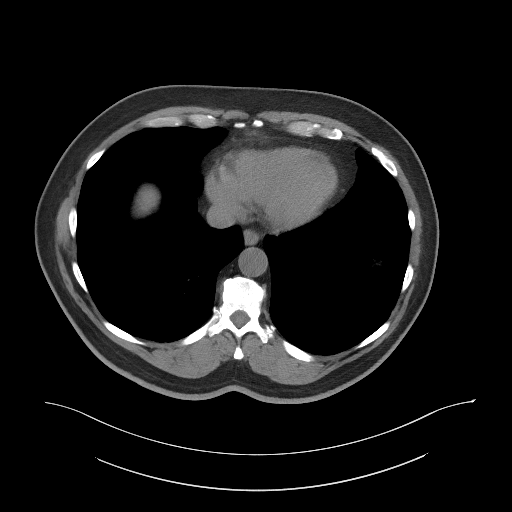

[Series 5: coronal st · coronal · 0.89mm/px · 3 of 111 slices shown]
[im 37/111  soft-tissue]
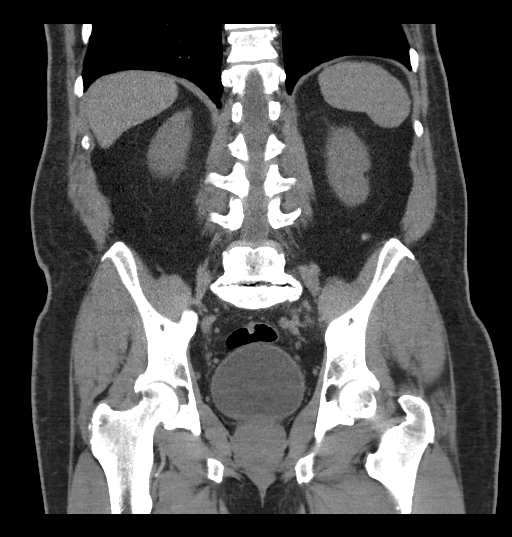
[im 49/111  soft-tissue]
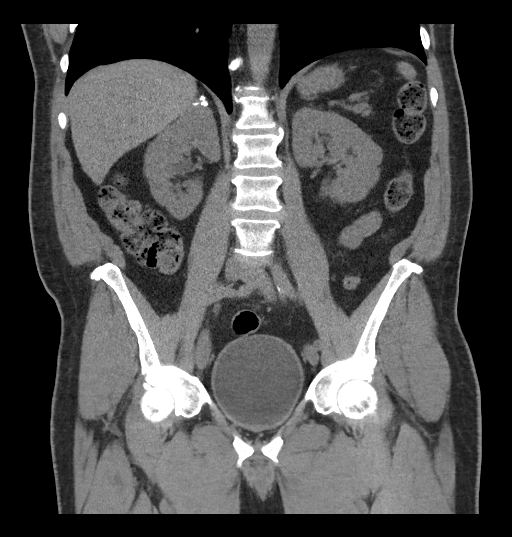
[im 62/111  soft-tissue]
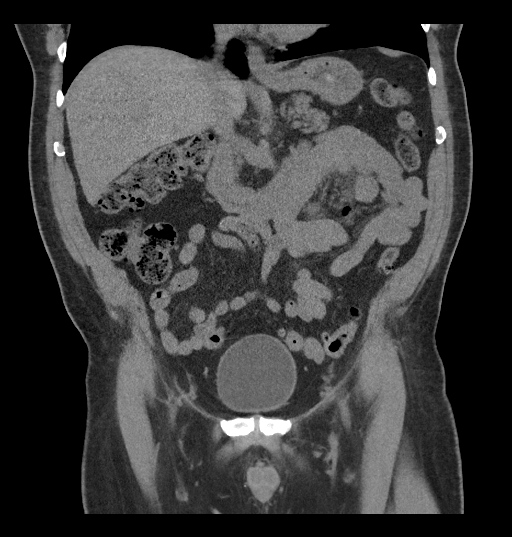

[16 of 46 positions shown; findings below may reference images not displayed]

FINDINGS: Lower chest: Clear lung bases. No significant pleural or pericardial
effusion.

Hepatobiliary: The liver appears unremarkable as imaged in the
noncontrast state. No evidence of gallstones, gallbladder wall
thickening or biliary dilatation.

Pancreas: Unremarkable. No pancreatic ductal dilatation or
surrounding inflammatory changes.

Spleen: Normal in size without focal abnormality.

Adrenals/Urinary Tract: Right adrenal calcifications are again
noted, unchanged from the previous study and consistent with prior
hemorrhage or infection. The left adrenal gland appears normal. As
evaluated in the noncontrast state, both kidneys appear normal. No
evidence of urinary tract calculus or hydronephrosis. The bladder is
mildly distended without focal abnormality.

Stomach/Bowel: No enteric contrast was administered. The stomach
appears unremarkable for its degree of distension. No evidence of
bowel wall thickening, distention or surrounding inflammatory
change. The previously demonstrated right lower quadrant
inflammatory changes have resolved. The appendix is not seen with
certainty. There is prominent stool throughout the colon with mild
sigmoid diverticulosis.

Vascular/Lymphatic: There are no enlarged abdominal or pelvic lymph
nodes. Mild aortic and branch vessel atherosclerosis.

Reproductive: The prostate gland and seminal vesicles appear
unremarkable.

Other: No evidence of abdominal wall mass or hernia. No ascites.

Musculoskeletal: No acute or significant osseous findings.
Multilevel lumbar spondylosis with osseous foraminal narrowing
greatest on the left at L5-S1.
IMPRESSION: 1. No acute findings or explanation for the patient's symptoms. No
evidence of urinary tract calculus or hydronephrosis.
2. The previously demonstrated inflammatory changes in the right
lower quadrant have resolved.
3. Stable chronic right adrenal calcifications.
4.  Aortic Atherosclerosis ([id number]).

## 2022-06-03 ENCOUNTER — Telehealth: Payer: Self-pay | Admitting: Orthopaedic Surgery

## 2022-06-03 NOTE — Telephone Encounter (Signed)
Received vm from pt inquiring if we had received his medical records from Dr. Cathie Hoops. IC,lmvm advised we have not received them yet.

## 2023-02-04 ENCOUNTER — Emergency Department (HOSPITAL_BASED_OUTPATIENT_CLINIC_OR_DEPARTMENT_OTHER)
Admission: EM | Admit: 2023-02-04 | Discharge: 2023-02-04 | Discharge status: Absent without leave | Payer: Medicare Other

## 2023-02-05 ENCOUNTER — Emergency Department (HOSPITAL_BASED_OUTPATIENT_CLINIC_OR_DEPARTMENT_OTHER): Payer: Medicare Other

## 2023-02-05 ENCOUNTER — Other Ambulatory Visit: Payer: Self-pay

## 2023-02-05 ENCOUNTER — Emergency Department (HOSPITAL_BASED_OUTPATIENT_CLINIC_OR_DEPARTMENT_OTHER)
Admission: EM | Admit: 2023-02-05 | Discharge: 2023-02-05 | Disposition: A | Discharge status: Home / Self Care | Payer: Medicare Other | Attending: Emergency Medicine | Admitting: Emergency Medicine

## 2023-02-05 ENCOUNTER — Encounter (HOSPITAL_BASED_OUTPATIENT_CLINIC_OR_DEPARTMENT_OTHER): Payer: Self-pay | Admitting: Urology

## 2023-02-05 DIAGNOSIS — Z7982 Long term (current) use of aspirin: Secondary | ICD-10-CM | POA: Diagnosis not present

## 2023-02-05 DIAGNOSIS — R519 Headache, unspecified: Secondary | ICD-10-CM | POA: Diagnosis present

## 2023-02-05 DIAGNOSIS — Z7984 Long term (current) use of oral hypoglycemic drugs: Secondary | ICD-10-CM | POA: Insufficient documentation

## 2023-02-05 DIAGNOSIS — E119 Type 2 diabetes mellitus without complications: Secondary | ICD-10-CM | POA: Insufficient documentation

## 2023-02-05 DIAGNOSIS — G8929 Other chronic pain: Secondary | ICD-10-CM

## 2023-02-05 LAB — CBC WITH DIFFERENTIAL/PLATELET
Abs Immature Granulocytes: 0.01 10*3/uL (ref 0.00–0.07)
Basophils Absolute: 0 10*3/uL (ref 0.0–0.1)
Basophils Relative: 0 %
Eosinophils Absolute: 0.1 10*3/uL (ref 0.0–0.5)
Eosinophils Relative: 1 %
HCT: 37.3 % — ABNORMAL LOW (ref 39.0–52.0)
Hemoglobin: 12.6 g/dL — ABNORMAL LOW (ref 13.0–17.0)
Immature Granulocytes: 0 %
Lymphocytes Relative: 34 %
Lymphs Abs: 2.2 10*3/uL (ref 0.7–4.0)
MCH: 30.4 pg (ref 26.0–34.0)
MCHC: 33.8 g/dL (ref 30.0–36.0)
MCV: 89.9 fL (ref 80.0–100.0)
Monocytes Absolute: 0.7 10*3/uL (ref 0.1–1.0)
Monocytes Relative: 11 %
Neutro Abs: 3.5 10*3/uL (ref 1.7–7.7)
Neutrophils Relative %: 54 %
Platelets: 194 10*3/uL (ref 150–400)
RBC: 4.15 MIL/uL — ABNORMAL LOW (ref 4.22–5.81)
RDW: 14.3 % (ref 11.5–15.5)
WBC: 6.5 10*3/uL (ref 4.0–10.5)
nRBC: 0 % (ref 0.0–0.2)

## 2023-02-05 LAB — COMPREHENSIVE METABOLIC PANEL
ALT: 30 U/L (ref 0–44)
AST: 30 U/L (ref 15–41)
Albumin: 3.7 g/dL (ref 3.5–5.0)
Alkaline Phosphatase: 51 U/L (ref 38–126)
Anion gap: 6 (ref 5–15)
BUN: 12 mg/dL (ref 8–23)
CO2: 29 mmol/L (ref 22–32)
Calcium: 9.2 mg/dL (ref 8.9–10.3)
Chloride: 103 mmol/L (ref 98–111)
Creatinine, Ser: 0.86 mg/dL (ref 0.61–1.24)
GFR, Estimated: >60 mL/min (ref >60)
Glucose, Bld: 101 mg/dL — ABNORMAL HIGH (ref 70–99)
Potassium: 4.5 mmol/L (ref 3.5–5.1)
Sodium: 138 mmol/L (ref 135–145)
Total Bilirubin: 0.5 mg/dL (ref 0.3–1.2)
Total Protein: 6.4 g/dL — ABNORMAL LOW (ref 6.5–8.1)

## 2023-02-05 NOTE — Discharge Instructions (Signed)
Please follow-up with your primary care doctor, and your neurologist.  Please call your primary care doctor prior to seeing them to make sure that they have your ER records prior to your appointment.  Return to the ED if you have severe headache, intractable nausea, vomiting, confusion, or dizziness.

## 2023-02-05 NOTE — ED Provider Notes (Signed)
[facility] Provider Note   CSN: [id number] Arrival date & time: 02/05/23  1342     History  Chief Complaint  Patient presents with   Headache    Arthur Spencer is a 77 y.o. male, history of diabetes, who presents to the ED secondary to right-sided headaches that have been going on for the last 4 to 5 months.  He states since September, he had severe pain in the right side of his head, and that it went away after few minutes, but since then he has had these headaches that are daily and, and on the right side of his head.  He states they range from mild to 7 out of 10, and states that they have been frequent, and are debilitating at times.  Has been taking aspirin with a little bit of relief, but still has pain.  He also reports that he has some nasal congestion on that side of his nose and has been to an ENT, and trialed antibiotics without relief of his symptoms.  Has even had CT outpatient, and it showed no sinus disease.  Home Medications Prior to Admission medications   Medication Sig Start Date End Date Taking? Authorizing Provider  albuterol (VENTOLIN HFA) 108 (90 Base) MCG/ACT inhaler Inhale 1-2 puffs into the lungs every 6 (six) hours as needed for wheezing or shortness of breath. 03/23/22   Tonye Pearson, PA-C  APPLE CIDER VINEGAR PO Take 1 capsule by mouth daily.    [provider]  Ascorbic Acid (VITAMIN C) 100 MG tablet Take 100 mg by mouth daily.    [provider]  atorvastatin (LIPITOR) 40 MG tablet Take 40 mg by mouth at bedtime. 06/24/19   [provider]  benzonatate (TESSALON) 100 MG capsule Take 1 capsule (100 mg total) by mouth every 8 (eight) hours. 03/23/22   Tonye Pearson, PA-C  Chromium 1 MG CAPS Take 1 mg by mouth daily.    [provider]  Cinnamon 500 MG capsule Take 500 mg by mouth daily.    [provider]  ezetimibe (ZETIA) 10 MG tablet Take 10 mg by mouth daily.     [provider]  hydrochlorothiazide (HYDRODIURIL) 25 MG tablet Take 25 mg by mouth daily.     [provider]  lidocaine (LIDODERM) 5 % Place 1 patch onto the skin daily. Remove & Discard patch within 12 hours or as directed by MD 01/16/22   Delia Heady, PA-C  lisinopril (ZESTRIL) 5 MG tablet Take 5 mg by mouth daily.  02/19/19   [provider]  metFORMIN (GLUCOPHAGE) 500 MG tablet Take 500 mg by mouth 2 (two) times daily with a meal.     [provider]  methocarbamol (ROBAXIN) 500 MG tablet Take 1 tablet (500 mg total) by mouth 2 (two) times daily. 01/16/22   Khatri, Hina, PA-C  niacin 100 MG tablet Take 100 mg by mouth at bedtime.    [provider]  Omega-3 Fatty Acids (FISH OIL) 1000 MG CAPS Take 1 capsule by mouth daily.    [provider]  polyethylene glycol (MIRALAX / GLYCOLAX) 17 g packet Take 17 g by mouth 2 (two) times daily. 09/14/19   Pokhrel, Corrie Mckusick, MD  saw palmetto 160 MG capsule Take 160 mg by mouth daily.    [provider]      Allergies    Patient has no known allergies.    Review of Systems  Review of Systems  Neurological:  Positive for headaches. Negative for dizziness.    Physical Exam Updated Vital Signs BP 126/67   Pulse (!) 49   Temp 98.5 F (36.9 C) (Oral)   Resp 12   Ht '6\' 1"'$  (1.854 m)   Wt 102.1 kg   SpO2 100%   BMI 29.70 kg/m  Physical Exam Vitals and nursing note reviewed.  Constitutional:      General: He is not in acute distress.    Appearance: He is well-developed.  HENT:     Head: Normocephalic and atraumatic.     Right Ear: Tympanic membrane normal.     Left Ear: Tympanic membrane normal.     Nose: Nose normal.     Right Turbinates: Not enlarged.     Left Turbinates: Not enlarged.  Eyes:     Conjunctiva/sclera: Conjunctivae normal.  Cardiovascular:     Rate and Rhythm: Normal rate and regular rhythm.     Heart sounds: No murmur heard. Pulmonary:     Effort: Pulmonary  effort is normal. No respiratory distress.     Breath sounds: Normal breath sounds.  Abdominal:     Palpations: Abdomen is soft.     Tenderness: There is no abdominal tenderness.  Musculoskeletal:        General: No swelling.     Cervical back: Neck supple.  Skin:    General: Skin is warm and dry.     Capillary Refill: Capillary refill takes less than 2 seconds.  Neurological:     Mental Status: He is alert.     Cranial Nerves: Cranial nerves 2-12 are intact. No cranial nerve deficit.     Sensory: Sensation is intact.     Motor: Motor function is intact. No weakness or tremor.     Coordination: Coordination is intact.     Comments: For mild puffiness of the right eye/orbit, but patient states this is normal for him.  Psychiatric:        Mood and Affect: Mood normal.     ED Results / Procedures / Treatments   Labs (all labs ordered are listed, but only abnormal results are displayed) Labs Reviewed  CBC WITH DIFFERENTIAL/PLATELET  COMPREHENSIVE METABOLIC PANEL    EKG EKG Interpretation  Date/Time:  Friday February 05 2023 17:20:31 EST Ventricular Rate:  56 PR Interval:  175 QRS Duration: 108 QT Interval:  478 QTC Calculation: 462 R Axis:   -75 Text Interpretation: Sinus rhythm Left anterior fascicular block Low voltage, precordial leads Confirmed by Octaviano Glow [phone]) on 02/05/2023 6:03:24 PM  Radiology CT Head Wo Contrast  Result Date: 02/05/2023 CLINICAL DATA:  Headache, new onset (Age >= 16y) EXAM: CT HEAD WITHOUT CONTRAST TECHNIQUE: Contiguous axial images were obtained from the base of the skull through the vertex without intravenous contrast. RADIATION DOSE REDUCTION: This exam was performed according to the departmental dose-optimization program which includes automated exposure control, adjustment of the mA and/or kV according to patient size and/or use of iterative reconstruction technique. COMPARISON:  None Available. FINDINGS: Brain: No intracranial  hemorrhage, mass effect, or midline shift. Age related atrophy. No hydrocephalus. The basilar cisterns are patent. Mild deep white matter hypodensity typical of chronic Edmon Magid vessel ischemia. No evidence of territorial infarct or acute ischemia. No extra-axial or intracranial fluid collection. Vascular: No hyperdense vessel or unexpected calcification. Skull: No fracture or focal lesion. Sinuses/Orbits: Trace mucosal thickening of the paranasal sinuses, similar to prior sinus CT. No sinus fluid level. No mastoid effusion.  Other: None. IMPRESSION: 1. No acute intracranial abnormality. 2. 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 Electronically Signed   By: Keith Rake M.D.   On: 02/05/2023 18:30    Procedures Procedures    Medications Ordered in ED Medications - No data to display  ED Course/ Medical Decision Making/ A&P                            Medical Decision Making Patient is a 77 year old male, here for headaches has been going on for the last 5 months, he states that it is daily, and is ranging from a 3 to a set of 7 out of 10 in nature on the right side of his head.  He has no neurodeficits however he does have a slightly more puffy right eye which she states is chronic for him.  We will obtain a head CT given his age, and basic labs.  EKG in case patient history of stroke.  No tenderness to palpation of temporal artery.  No vision changes.  Headache is not positional or worse in the morning.  Amount and/or Complexity of Data Reviewed Labs: ordered. Radiology: ordered.    Details: Head CT unremarkable.  No acute changes ECG/medicine tests:  Decision-making details documented in ED Course. Discussion of management or test interpretation with external provider(s): Discussed with patient, head CT unremarkable, high flow nonrebreather, patient's headache is resolved after words, possibly cluster headache.  I discussed that he will need to follow-up with neurology, return  precautions emphasized.     Final Clinical Impression(s) / ED Diagnoses Final diagnoses:  Chronic nonintractable headache, unspecified headache type    Rx / DC Orders ED Discharge Orders     None         Osvaldo Shipper, PA 02/05/23 1927    Wyvonnia Dusky, MD 02/05/23 [phone]

## 2023-02-05 NOTE — ED Triage Notes (Signed)
Pt states headaches that have been intermittent for 4-5 months  No other related symptoms  States pain is worse on right side of head   H/o htn

## 2023-07-26 ENCOUNTER — Emergency Department (HOSPITAL_BASED_OUTPATIENT_CLINIC_OR_DEPARTMENT_OTHER): Payer: Medicare Other

## 2023-07-26 ENCOUNTER — Encounter (HOSPITAL_BASED_OUTPATIENT_CLINIC_OR_DEPARTMENT_OTHER): Payer: Self-pay | Admitting: Emergency Medicine

## 2023-07-26 ENCOUNTER — Emergency Department (HOSPITAL_BASED_OUTPATIENT_CLINIC_OR_DEPARTMENT_OTHER)
Admission: EM | Admit: 2023-07-26 | Discharge: 2023-07-26 | Disposition: A | Discharge status: Home / Self Care | Payer: Medicare Other | Source: Home / Self Care | Attending: Emergency Medicine | Admitting: Emergency Medicine

## 2023-07-26 ENCOUNTER — Other Ambulatory Visit: Payer: Self-pay

## 2023-07-26 DIAGNOSIS — M545 Low back pain, unspecified: Secondary | ICD-10-CM | POA: Diagnosis present

## 2023-07-26 DIAGNOSIS — M5441 Lumbago with sciatica, right side: Secondary | ICD-10-CM

## 2023-07-26 DIAGNOSIS — M25551 Pain in right hip: Secondary | ICD-10-CM

## 2023-07-26 MED ORDER — PREDNISONE 20 MG PO TABS: 40.0000 mg | ORAL_TABLET | Freq: Every day | ORAL | 0 refills | Status: DC

## 2023-07-26 NOTE — Discharge Instructions (Signed)
Please read and follow all provided instructions.  Your diagnoses today include:  1. Acute right-sided low back pain with right-sided sciatica   2. 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 If you are diabetic, monitor your blood sugar closely and stop taking Prednisone if blood sugar is over 300. Take with food to prevent stomach upset.   Take any prescribed medications only as directed.  Home care instructions:  Follow any educational materials contained in this packet Please rest, use ice or heat on your back for the next several days Do not lift, push, pull anything more than 10 pounds for the next week  Follow-up instructions: Please follow-up with your primary care provider in the next 5 days for further evaluation of your symptoms if not improving.   Return instructions:  SEEK IMMEDIATE MEDICAL ATTENTION IF YOU HAVE: New numbness, tingling, weakness, or problem with the use of your arms or legs Severe back pain not relieved with medications Loss control of your bowels or bladder Increasing pain in any areas of the body (such as chest or abdominal pain) Shortness of breath, dizziness, or fainting.  Worsening nausea (feeling sick to your stomach), vomiting, fever, or sweats Any other emergent concerns regarding your health   Additional Information:  Your vital signs today were: BP (!) 164/58 (BP Location: Left Arm)   Pulse (!) 56   Temp 98 F (36.7 C)   Resp 20   Wt 97.5 kg   SpO2 98%   BMI 28.37 kg/m  If your blood pressure (BP) was elevated above 135/85 this visit, please have this repeated by your doctor within one month. --------------

## 2023-07-26 NOTE — ED Provider Notes (Signed)
[place] [facility] Provider Note   CSN: [id number] Arrival date & time: 07/26/23  1408     History  Chief Complaint  Patient presents with   Back Pain    lower    Arthur Spencer is a 77 y.o. male.  Patient with history of 2 prior lumbar surgeries presents to the emergency department for right-sided lower back pain with radiation into the right hip and leg.  Symptoms started at 2 to 3 days ago.  He denies any acute injuries, falls or accidents.  Pain is worse with certain positions and motions of his hip and leg.  Pain is throbbing in nature.  No distal numbness or tingling.  Symptoms were left-sided prior to surgeries in 2009 and 2016.  Patient has a history of recently elevated PSA, followed by urology.  No prostate enlargement noted on CT in the past. Patient denies warning symptoms of back pain including: fecal incontinence, urinary retention or overflow incontinence, night sweats, waking from sleep with back pain, unexplained fevers or weight loss, h/o cancer, IVDU, recent trauma.          Home Medications Prior to Admission medications   Medication Sig Start Date End Date Taking? Authorizing Provider  albuterol (VENTOLIN HFA) 108 (90 Base) MCG/ACT inhaler Inhale 1-2 puffs into the lungs every 6 (six) hours as needed for wheezing or shortness of breath. 03/23/22   Janell Quiet, PA-C  APPLE CIDER VINEGAR PO Take 1 capsule by mouth daily.    [provider]  Ascorbic Acid (VITAMIN C) 100 MG tablet Take 100 mg by mouth daily.    [provider]  atorvastatin (LIPITOR) 40 MG tablet Take 40 mg by mouth at bedtime. 06/24/19   [provider]  benzonatate (TESSALON) 100 MG capsule Take 1 capsule (100 mg total) by mouth every 8 (eight) hours. 03/23/22   Janell Quiet, PA-C  Chromium 1 MG CAPS Take 1 mg by mouth daily.    [provider]  Cinnamon 500 MG capsule Take 500 mg by mouth daily.    [provider]  ezetimibe (ZETIA) 10 MG tablet Take 10 mg by mouth daily.    [provider]  hydrochlorothiazide (HYDRODIURIL) 25 MG tablet Take 25 mg by mouth daily.     [provider]  lidocaine (LIDODERM) 5 % Place 1 patch onto the skin daily. Remove & Discard patch within 12 hours or as directed by MD 01/16/22   Dietrich Pates, PA-C  lisinopril (ZESTRIL) 5 MG tablet Take 5 mg by mouth daily.  02/19/19   [provider]  metFORMIN (GLUCOPHAGE) 500 MG tablet Take 500 mg by mouth 2 (two) times daily with a meal.     [provider]  methocarbamol (ROBAXIN) 500 MG tablet Take 1 tablet (500 mg total) by mouth 2 (two) times daily. 01/16/22   Khatri, Hina, PA-C  niacin 100 MG tablet Take 100 mg by mouth at bedtime.    [provider]  Omega-3 Fatty Acids (FISH OIL) 1000 MG CAPS Take 1 capsule by mouth daily.    [provider]  polyethylene glycol (MIRALAX / GLYCOLAX) 17 g packet Take 17 g by mouth 2 (two) times daily. 09/14/19   Pokhrel, Rebekah Chesterfield, MD  saw palmetto 160 MG capsule Take 160 mg by mouth daily.    [provider]      Allergies    Patient has no known allergies.    Review of Systems  Review of Systems  Physical Exam Updated Vital Signs BP (!) 164/58 (BP Location: Left Arm)   Pulse (!) 56   Temp 98 F (36.7 C)   Resp 20   Wt 97.5 kg   SpO2 98%   BMI 28.37 kg/m  Physical Exam Vitals and nursing note reviewed.  Constitutional:      Appearance: He is well-developed.  HENT:     Head: Normocephalic and atraumatic.  Eyes:     Conjunctiva/sclera: Conjunctivae normal.  Abdominal:     Palpations: Abdomen is soft.     Tenderness: There is no abdominal tenderness. There is no right CVA tenderness or left CVA tenderness.  Musculoskeletal:        Spencer: Normal range of motion.     Cervical back: Normal range of motion.     Thoracic back: No bony tenderness. Normal range of motion.     Lumbar back: Tenderness present. No bony  tenderness.       Back:     Comments: No step-off noted with palpation of spine.   Skin:    Spencer: Skin is warm and dry.  Neurological:     Mental Status: He is alert.     Sensory: No sensory deficit.     Motor: No abnormal muscle tone.     Comments: 5/5 strength in entire lower extremities bilaterally. No sensation deficit.   Psychiatric:        Mood and Affect: Mood normal.     ED Results / Procedures / Treatments   Labs (all labs ordered are listed, but only abnormal results are displayed) Labs Reviewed - No data to display  EKG None  Radiology CT Lumbar Spine Wo Contrast  Result Date: 07/26/2023 CLINICAL DATA:  Right-sided low back pain beginning 2 days ago. Increased fracture risk. Right leg radicular symptoms. EXAM: CT LUMBAR SPINE WITHOUT CONTRAST TECHNIQUE: Multidetector CT imaging of the lumbar spine was performed without intravenous contrast administration. Multiplanar CT image reconstructions were also generated. RADIATION DOSE REDUCTION: This exam was performed according to the departmental dose-optimization program which includes automated exposure control, adjustment of the mA and/or kV according to patient size and/or use of iterative reconstruction technique. COMPARISON:  None Available. FINDINGS: Segmentation: 5 lumbar type vertebral bodies. Alignment: 2-3 mm degenerative anterolisthesis L4-5 and 2-3 mm degenerative retrolisthesis L5-S1. Vertebrae: No fracture or focal bone lesion. Paraspinal and other soft tissues: Negative Disc levels: No significant finding from T12-L1 through L2-3. L3-4: Minimal disc bulge. Bilateral facet degeneration and hypertrophy. Mild narrowing of the lateral recesses but no likely neural compression. L4-5: Advanced bilateral facet arthropathy with 2-3 mm of anterolisthesis. Mild bulging of the disc. Stenosis of both lateral recesses. Definite neural compression is not established, but could occur. L5-S1: 2-3 mm of retrolisthesis. Disc  degeneration with loss of disc height and vacuum phenomenon. Prominent endplate osteophytes. Bilateral facet osteoarthritis. Previous left hemilaminectomy. Stenosis of the subarticular lateral recesses and neural foramina that could cause neural compression on either or both sides. IMPRESSION: 1. No acute finding by CT. 2. L3-4: Minimal disc bulge. Bilateral facet osteoarthritis. Mild lateral recess narrowing but no likely neural compression. 3. L4-5: Advanced bilateral facet arthropathy with 2-3 mm of anterolisthesis. Mild disc bulge. Stenosis of both lateral recesses. Definite neural compression is not established, but could occur. 4. L5-S1: 2-3 mm of retrolisthesis. Disc degeneration with loss of disc height and vacuum phenomenon. Prominent endplate osteophytes. Bilateral facet osteoarthritis. Previous left hemilaminectomy. Stenosis of the subarticular lateral recesses and neural foramina  that could cause neural compression on either or both sides. Electronically Signed   By: Paulina Fusi M.D.   On: 07/26/2023 16:56    Procedures Procedures    Medications Ordered in ED Medications - No data to display  ED Course/ Medical Decision Making/ A&P    Patient seen and examined. History obtained directly from patient.    Labs/EKG: None ordered.  Imaging: After discussion with patient, CT lumbar spine ordered.  Medications/Fluids: None ordered  Most recent vital signs reviewed and are as follows: BP (!) 164/58 (BP Location: Left Arm)   Pulse (!) 56   Temp 98 F (36.7 C)   Resp 20   Wt 97.5 kg   SpO2 98%   BMI 28.37 kg/m   Initial impression: Low back pain with radicular features, new back pain with right-sided leg and hip pain as opposed to left-sided like he has had in the past.  Discussed treatment with close follow-up with patient versus imaging today given red flag of age and new features of back pain.  He would like to proceed with imaging.  CT lumbar spine ordered.  5:40 PM  Reassessment performed. Patient appears stable.  Patient's wife now at bedside.  We reviewed results of CT imaging of the lumbar spine.  Imaging personally visualized and interpreted including: CT lumbar spine, agree note.  Radiologist did note foraminal narrowing in the lower lumbar area.  Reviewed pertinent lab work and imaging with patient at bedside. Questions answered.   Most current vital signs reviewed and are as follows: BP (!) 164/58 (BP Location: Left Arm)   Pulse (!) 56   Temp 98 F (36.7 C)   Resp 20   Wt 97.5 kg   SpO2 98%   BMI 28.37 kg/m   Plan: Will treat as radiculopathy.  We discussed low-dose prednisone.  Patient monitors his blood sugar regularly and it sounds well-controlled.  Encouraged discontinuation of medication if blood sugars greater than 300.  He may use over-the-counter medications for pain.  Home treatment plan: Patient was counseled on back pain precautions and advised to do activity as tolerated but avoid strenuous activity and do not lift, push, or pull heavy objects more than 10 pounds for the next week.  Patient counseled to use ice or heat on back as needed for pain and spasm.    Medications prescribed: Prednisone  Return instructions discussed with patient: Urged to return with worsening severe pain, loss of bowel or bladder control, trouble walking, development of weakness in the legs, or with any other concerns.  Follow-up instructions discussed with patient: Patient urged to follow-up with PCP if pain does not improve with treatment and rest or if pain becomes recurrent.                                 Medical Decision Making Amount and/or Complexity of Data Reviewed Radiology: ordered.  Risk Prescription drug management.   Patient with back pain. No neurological deficits. Patient is ambulatory.  Back and hip pain is different than what he is typically felt in the past.  No warning symptoms of back pain including: fecal incontinence,  urinary retention or overflow incontinence, night sweats, waking from sleep with back pain, unexplained fevers or weight loss, h/o cancer, IVDU, recent trauma. No concern for cauda equina, epidural abscess, or other serious cause of back pain.  CT imaging is reassuring and no signs of pulm disease or  compression fracture noted.  Possible foraminal narrowing in the lower lumbar spine, could be contributing.  Conservative measures such as rest, ice/heat and pain medicine indicated with PCP follow-up if no improvement with conservative management.          Final Clinical Impression(s) / ED Diagnoses Final diagnoses:  Acute right-sided low back pain with right-sided sciatica  Pain of right hip    Rx / DC Orders ED Discharge Orders          Ordered    predniSONE (DELTASONE) 20 MG tablet  Daily        07/26/23 1731              Renne Crigler, PA-C 07/26/23 1742    Arby Barrette, MD 07/27/23 [phone]

## 2023-07-26 NOTE — ED Triage Notes (Signed)
Right lower back pain started 2 days ago . Radiated to right leg . Hx surgery for herniated disk.  No urinary symptoms

## 2023-08-31 ENCOUNTER — Other Ambulatory Visit (HOSPITAL_BASED_OUTPATIENT_CLINIC_OR_DEPARTMENT_OTHER): Payer: Self-pay | Admitting: Orthopedic Surgery

## 2023-08-31 DIAGNOSIS — M4316 Spondylolisthesis, lumbar region: Secondary | ICD-10-CM

## 2023-09-15 ENCOUNTER — Ambulatory Visit (HOSPITAL_BASED_OUTPATIENT_CLINIC_OR_DEPARTMENT_OTHER)
Admission: RE | Admit: 2023-09-15 | Discharge: 2023-09-15 | Disposition: A | Discharge status: Home / Self Care | Payer: Medicare Other | Source: Ambulatory Visit | Attending: Orthopedic Surgery | Admitting: Orthopedic Surgery

## 2023-09-15 DIAGNOSIS — M4316 Spondylolisthesis, lumbar region: Secondary | ICD-10-CM | POA: Insufficient documentation

## 2023-09-15 MED ORDER — GADOBUTROL 1 MMOL/ML IV SOLN
10.0000 mL | Freq: Once | INTRAVENOUS | Status: AC | PRN
  Administered 2023-09-15: 10 mL via INTRAVENOUS

## 2023-12-02 ENCOUNTER — Other Ambulatory Visit: Payer: Self-pay | Admitting: Urology

## 2023-12-02 DIAGNOSIS — R972 Elevated prostate specific antigen [PSA]: Secondary | ICD-10-CM

## 2024-01-15 ENCOUNTER — Ambulatory Visit
Admission: RE | Admit: 2024-01-15 | Discharge: 2024-01-15 | Disposition: A | Discharge status: Home / Self Care | Payer: Medicare Other | Source: Ambulatory Visit | Attending: Urology | Admitting: Urology

## 2024-01-15 DIAGNOSIS — R972 Elevated prostate specific antigen [PSA]: Secondary | ICD-10-CM

## 2024-01-15 MED ORDER — GADOPICLENOL 0.5 MMOL/ML IV SOLN
10.0000 mL | Freq: Once | INTRAVENOUS | Status: AC | PRN
  Administered 2024-01-15: 10 mL via INTRAVENOUS

## 2024-03-15 ENCOUNTER — Other Ambulatory Visit (HOSPITAL_COMMUNITY): Payer: Self-pay | Admitting: Urology

## 2024-03-15 DIAGNOSIS — C61 Malignant neoplasm of prostate: Secondary | ICD-10-CM

## 2024-03-20 NOTE — Progress Notes (Signed)
 RN spoke with patient to follow up regarding PSMA PET prior to radiation oncology consult.  Patient will be seeing Dr. Cardell Peach this Thursday with his wife to review biopsy results, as his wife wasn't present at previous appointment.  Patient will then reach out to nuclear medicine to get PSMA PET schedule.  RN will follow to ensure consult is scheduled after PSMA PET.  RN provided direct contact number for any questions or concerns.

## 2024-03-24 NOTE — Progress Notes (Signed)
 RN left message to follow up after visit with Dr. Cardell Peach on 4/10 and assess if patient needed assistance with scheduling PSMA PET prior to scheduling rad onc consult.

## 2024-03-28 ENCOUNTER — Telehealth: Payer: Self-pay | Admitting: Radiation Oncology

## 2024-03-28 NOTE — Telephone Encounter (Signed)
 LVM on home line. Able to speak with pt on mobile number and set up appt for 4/29@12pm 

## 2024-04-03 ENCOUNTER — Ambulatory Visit (HOSPITAL_COMMUNITY)
Admission: RE | Admit: 2024-04-03 | Discharge: 2024-04-03 | Disposition: A | Discharge status: Home / Self Care | Source: Ambulatory Visit | Attending: Urology | Admitting: Urology

## 2024-04-03 DIAGNOSIS — C61 Malignant neoplasm of prostate: Secondary | ICD-10-CM | POA: Insufficient documentation

## 2024-04-03 MED ORDER — FLOTUFOLASTAT F 18 GALLIUM 296-5846 MBQ/ML IV SOLN
8.5100 | Freq: Once | INTRAVENOUS | Status: AC
  Administered 2024-04-03: 8.51 via INTRAVENOUS

## 2024-04-04 NOTE — Progress Notes (Signed)
 GU Location of Tumor / Histology: Prostate Ca  If Prostate Cancer, Gleason Score is (4 + 3) and PSA is (5.66 on 11/18/2023)  PSA 3.92 on 05/19/2023  [facility] presented as referral from Dr. Doy Gene [facility]) elevated PSA.  Biopsies     04/03/2024 Dr. Doy Gene NM PET (PSMA) Skull to Mid Thigh CLINICAL DATA:     01/15/2024 Dr. Doy Gene MR Prostate with/without Contrast CLINICAL DATA:  Elevated PSA level of 5.66 on 11/18/2023. R97.20   1. 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 2. Two PI-RADS category 3 lesions of the peripheral zone. 3.  Targeting data sent to [facility]. 4. Sigmoid colon diverticulosis. 5. 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    Past/Anticipated interventions by urology, if any:  Dr. Doy Gene   Past/Anticipated interventions by medical oncology, if any: NA  Weight changes, if any:  No  IPSS:  8 SHIM:  24  Bowel/Bladder complaints, if any:  No  Nausea/Vomiting, if any:  No  Pain issues, if any:  Arthritis  SAFETY ISSUES: Prior radiation? No Pacemaker/ICD? No Possible current pregnancy? Male Is the patient on methotrexate? No  Current Complaints / other details:

## 2024-04-07 ENCOUNTER — Encounter: Payer: Self-pay | Admitting: Radiation Oncology

## 2024-04-11 ENCOUNTER — Encounter: Payer: Self-pay | Admitting: Radiation Oncology

## 2024-04-11 ENCOUNTER — Encounter: Payer: Self-pay | Admitting: Urology

## 2024-04-11 ENCOUNTER — Ambulatory Visit
Admission: RE | Admit: 2024-04-11 | Discharge: 2024-04-11 | Disposition: A | Discharge status: Home / Self Care | Source: Ambulatory Visit | Attending: Radiation Oncology | Admitting: Radiation Oncology

## 2024-04-11 VITALS — BP 186/75 | HR 57 | Temp 98.0°F | Resp 18 | Ht 73.0 in | Wt 239.6 lb

## 2024-04-11 DIAGNOSIS — I700 Atherosclerosis of aorta: Secondary | ICD-10-CM | POA: Diagnosis not present

## 2024-04-11 DIAGNOSIS — C61 Malignant neoplasm of prostate: Secondary | ICD-10-CM | POA: Diagnosis present

## 2024-04-11 DIAGNOSIS — Z7984 Long term (current) use of oral hypoglycemic drugs: Secondary | ICD-10-CM | POA: Diagnosis not present

## 2024-04-11 DIAGNOSIS — E7800 Pure hypercholesterolemia, unspecified: Secondary | ICD-10-CM | POA: Diagnosis not present

## 2024-04-11 DIAGNOSIS — E119 Type 2 diabetes mellitus without complications: Secondary | ICD-10-CM | POA: Diagnosis not present

## 2024-04-11 DIAGNOSIS — I10 Essential (primary) hypertension: Secondary | ICD-10-CM | POA: Insufficient documentation

## 2024-04-11 DIAGNOSIS — Z79899 Other long term (current) drug therapy: Secondary | ICD-10-CM | POA: Diagnosis not present

## 2024-04-11 DIAGNOSIS — Z7982 Long term (current) use of aspirin: Secondary | ICD-10-CM | POA: Diagnosis not present

## 2024-04-11 DIAGNOSIS — K5730 Diverticulosis of large intestine without perforation or abscess without bleeding: Secondary | ICD-10-CM | POA: Insufficient documentation

## 2024-04-11 NOTE — Progress Notes (Signed)
 Radiation Oncology         [phone] ________________________________  Initial Outpatient Consultation  Name: Arthur Spencer MRN: [id number]  Date: 04/11/2024  DOB: [birth year]  CC:Nohemi Batters, MD  Lahoma Pigg, MD   REFERRING PHYSICIAN: Lahoma Pigg, MD  DIAGNOSIS: 78 y.o. gentleman with Stage T1c adenocarcinoma of the prostate with Gleason score of 4+3, and PSA of 5.66.    ICD-10-CM   1. Malignant neoplasm of prostate (HCC)  C61       HISTORY OF PRESENT ILLNESS: Arthur Spencer is a 78 y.o. male with a diagnosis of prostate cancer. He was initially referred to Dr. Annitta Kindler back in 2023 for an elevated PSA of 4.03, digital rectal examination at that time was without concerning findings or discrete nodules. His PSA was monitored closely and fluctuated, but overall, remained stable through 05/2023. The PSA increased to 5.66 in 11/2023, prompting a prostate MRI on 01/15/24 that showed a PI-RADS 4 lesion in the right anterior transition zone and right anterior fibromuscular stroma and two PI-RADS 3 lesions in the peripheral zone. The patient met with Dr. Freddi Jaeger in 11/2023 and proceeded to MRI fusion biopsy of the prostate on 03/07/24, digital rectal examination performed at that time showed no nodules.  The prostate volume measured 32 cc.  Out of 21 core biopsies, 15 were positive.  The maximum Gleason score was 4+3, and this was seen in the left apex. Additionally, Gleason 3+4 was seen in 1 of 3 samples from the MRI ROI #2 on the right, and in the right mid, right apex, and right mid lateral, and Gleason 3+3 in the right apex lateral (small focus), left mid (small focus), 3 of 3 samples from MRI ROI on the left, and 2 of 3 samples from the MRI ROI #2 on the right (small focus).  He underwent staging PSMA PET scan on 04/03/24 showing no evidence of disease outside of the prostate.  The patient reviewed the biopsy and imaging results with his urologist and he has kindly been referred today for  discussion of potential radiation treatment options.   PREVIOUS RADIATION THERAPY: No  PAST MEDICAL HISTORY:  Past Medical History:  Diagnosis Date   Diabetes mellitus without complication (HCC)    Epididymitis 02/13/2018   High cholesterol    Hypertension       PAST SURGICAL HISTORY: Past Surgical History:  Procedure Laterality Date   INGUINAL HERNIA REPAIR     As infant   LUMBAR SPINE SURGERY  2009   discectomy   LUMBAR SPINE SURGERY  2015   enlarged nerve canal    PROSTATE BIOPSY     TONSILLECTOMY      FAMILY HISTORY: No family history on file.  SOCIAL HISTORY:  Social History   Socioeconomic History   Marital status: Married    Spouse name: Not on file   Number of children: Not on file   Years of education: Not on file   Highest education level: Not on file  Occupational History   Not on file  Tobacco Use   Smoking status: Never   Smokeless tobacco: Never  Vaping Use   Vaping status: Never Used  Substance and Sexual Activity   Alcohol use: Not Currently    Comment: rarely   Drug use: Never   Sexual activity: Not on file  Other Topics Concern   Not on file  Social History Narrative   Not on file   Social Drivers of Corporate investment banker  Strain: Not on file  Food Insecurity: No Food Insecurity (04/11/2024)   Hunger Vital Sign    Worried About Running Out of Food in the Last Year: Never true    Ran Out of Food in the Last Year: Never true  Transportation Needs: No Transportation Needs (04/11/2024)   PRAPARE - Administrator, Civil Service (Medical): No    Lack of Transportation (Non-Medical): No  Physical Activity: Not on file  Stress: Not on file  Social Connections: Not on file  Intimate Partner Violence: Not At Risk (04/11/2024)   Humiliation, Afraid, Rape, and Kick questionnaire    Fear of Current or Ex-Partner: No    Emotionally Abused: No    Physically Abused: No    Sexually Abused: No    ALLERGIES: Patient has no known  allergies.  MEDICATIONS:  Current Outpatient Medications  Medication Sig Dispense Refill   Accu-Chek Softclix Lancets lancets SMARTSIG:1 Topical Daily     APPLE CIDER VINEGAR PO Take 1 capsule by mouth daily.     Ascorbic Acid (VITAMIN C) 100 MG tablet Take 100 mg by mouth daily.     atorvastatin  (LIPITOR) 40 MG tablet Take 40 mg by mouth at bedtime.     Blood Glucose Monitoring Suppl (ACCU-CHEK GUIDE) w/Device KIT      Chromium 1 MG CAPS Take 1 mg by mouth daily.     Cinnamon 500 MG capsule Take 500 mg by mouth daily.     ezetimibe (ZETIA) 10 MG tablet Take 10 mg by mouth daily.     glucosamine-chondroitin 500-400 MG tablet Take 1 tablet by mouth.     hydrochlorothiazide (HYDRODIURIL) 25 MG tablet Take 25 mg by mouth daily.      ipratropium (ATROVENT) 0.06 % nasal spray 1 spray.     Lactobacillus Rhamnosus, GG, (CULTURELLE) CAPS Take 1 capsule by mouth daily.     lisinopril  (ZESTRIL ) 5 MG tablet Take 5 mg by mouth daily.      Magnesium  Oxide -Mg Supplement 250 MG TABS Take 1 tablet by mouth daily.     metFORMIN (GLUCOPHAGE) 500 MG tablet Take 500 mg by mouth 2 (two) times daily with a meal.      montelukast (SINGULAIR) 10 MG tablet Take 10 mg by mouth.     niacin 100 MG tablet Take 100 mg by mouth at bedtime.     OIL OF OREGANO PO Take by mouth.     Omega-3 Fatty Acids (FISH OIL) 1000 MG CAPS Take 1 capsule by mouth daily.     polyethylene glycol (MIRALAX  / GLYCOLAX ) 17 g packet Take 17 g by mouth 2 (two) times daily. 14 each 0   saw palmetto 160 MG capsule Take 160 mg by mouth daily.     albuterol  (VENTOLIN  HFA) 108 (90 Base) MCG/ACT inhaler Inhale 1-2 puffs into the lungs every 6 (six) hours as needed for wheezing or shortness of breath. 6.7 each 0   aspirin EC 325 MG tablet Take 325 mg by mouth daily.     Coenzyme Q10 10 MG capsule Take 100 mg by mouth.     lidocaine  (LIDODERM ) 5 % Place 1 patch onto the skin daily. Remove & Discard patch within 12 hours or as directed by MD 30 patch  0   methocarbamol  (ROBAXIN ) 500 MG tablet Take 1 tablet (500 mg total) by mouth 2 (two) times daily. 20 tablet 0   No current facility-administered medications for this encounter.    REVIEW OF SYSTEMS:  On review of systems, the patient reports that he is doing well overall. He denies any chest pain, shortness of breath, cough, fevers, chills, night sweats, unintended weight changes. He denies any bowel disturbances, and denies abdominal pain, nausea or vomiting. He denies any new musculoskeletal or joint aches or pains. His IPSS was 8, indicating mild urinary symptoms. His SHIM was 24, indicating he does not have erectile dysfunction. A complete review of systems is obtained and is otherwise negative.    PHYSICAL EXAM:  Wt Readings from Last 3 Encounters:  04/11/24 239 lb 9.6 oz (108.7 kg)  07/26/23 215 lb (97.5 kg)  02/05/23 225 lb 1.4 oz (102.1 kg)   Temp Readings from Last 3 Encounters:  04/11/24 98 F (36.7 C) (Oral)  07/26/23 98 F (36.7 C)  02/05/23 98.6 F (37 C) (Oral)   BP Readings from Last 3 Encounters:  04/11/24 (!) 186/75  07/26/23 (!) 164/58  02/05/23 135/70   Pulse Readings from Last 3 Encounters:  04/11/24 (!) 57  07/26/23 (!) 56  02/05/23 (!) 47   Pain Assessment Pain Score: 0-No pain/10  In general this is a well appearing man in no acute distress. He's alert and oriented x4 and appropriate throughout the examination. Cardiopulmonary assessment is negative for acute distress, and he exhibits normal effort.     KPS = 100  100 - Normal; no complaints; no evidence of disease. 90   - Able to carry on normal activity; minor signs or symptoms of disease. 80   - Normal activity with effort; some signs or symptoms of disease. 67   - Cares for self; unable to carry on normal activity or to do active work. 60   - Requires occasional assistance, but is able to care for most of his personal needs. 50   - Requires considerable assistance and frequent medical  care. 40   - Disabled; requires special care and assistance. 30   - Severely disabled; hospital admission is indicated although death not imminent. 20   - Very sick; hospital admission necessary; active supportive treatment necessary. 10   - Moribund; fatal processes progressing rapidly. 0     - Dead  Karnofsky DA, Abelmann WH, Craver LS and Burchenal JH [phone]) The use of the nitrogen mustards in the palliative treatment of carcinoma: with particular reference to bronchogenic carcinoma Cancer 1 634-56  LABORATORY DATA:  Lab Results  Component Value Date   WBC 6.5 02/05/2023   HGB 12.6 (L) 02/05/2023   HCT 37.3 (L) 02/05/2023   MCV 89.9 02/05/2023   PLT 194 02/05/2023   Lab Results  Component Value Date   NA 138 02/05/2023   K 4.5 02/05/2023   CL 103 02/05/2023   CO2 29 02/05/2023   Lab Results  Component Value Date   ALT 30 02/05/2023   AST 30 02/05/2023   ALKPHOS 51 02/05/2023   BILITOT 0.5 02/05/2023     RADIOGRAPHY: NM PET (PSMA) SKULL TO MID THIGH Result Date: 04/06/2024 CLINICAL DATA:  Prostate cancer.  Biopsy last month. EXAM: NUCLEAR MEDICINE PET SKULL BASE TO THIGH TECHNIQUE: 8.5 mCi Flotufolastat (Posluma ) was injected intravenously. Full-ring PET imaging was performed from the skull base to thigh after the radiotracer. CT data was obtained and used for attenuation correction and anatomic localization. COMPARISON:  Prostate MR of 01/15/2024. Abdominopelvic CT 01/16/2022 FINDINGS: NECK No radiotracer activity in neck lymph nodes. Incidental CT finding: No cervical adenopathy. CHEST No radiotracer accumulation within mediastinal or hilar lymph nodes. No suspicious  pulmonary nodules on the CT scan. Incidental CT finding: Aortic atherosclerosis. Mild cardiomegaly. Mediastinal calcified node is likely related to old granulomatous disease. ABDOMEN/PELVIS Prostate: Heterogeneous nonspecific affinity throughout the prostate. The most focal area is within the anterior apex, possibly  corresponding to the PI-RADS(v2.1) -4 lesion on MRI. Example at a S.U.V. max of 4.9. Lymph nodes: No abnormal radiotracer accumulation within pelvic or abdominal nodes. Liver: No evidence of liver metastasis. Incidental CT finding: Right adrenal calcified lesion again identified at 2.5 cm, likely related to remote hemorrhage or infection. Aortic atherosclerosis. No abdominopelvic adenopathy. Scattered colonic diverticula. Small, right larger than left fat containing inguinal hernias. SKELETON No focal activity to suggest skeletal metastasis. IMPRESSION: 1. Nonspecific heterogeneous tracer affinity throughout the prostate. 2. No tracer avid metastatic disease. 3.  Aortic Atherosclerosis (ICD10-I70.0). Electronically Signed   By: Lore Rode M.D.   On: 04/06/2024 15:06      IMPRESSION/PLAN: 1. 78 y.o. gentleman with Stage T1c adenocarcinoma of the prostate with Gleason Score of 4+3, and PSA of 5.66. We discussed the patient's workup and outlined the nature of prostate cancer in this setting. The patient's T stage, Gleason's score, and PSA put him into the unfavorable intermediate risk group. Accordingly, he is eligible for a variety of potential treatment options including brachytherapy, 5.5 weeks of external radiation +/- ADT, or prostatectomy. We discussed the available radiation techniques, and focused on the details and logistics of delivery. We discussed and outlined the risks, benefits, short and long-term effects associated with radiotherapy and compared and contrasted these with prostatectomy. We discussed the role of SpaceOAR gel in reducing the rectal toxicity associated with radiotherapy. He appears to have a good understanding of his disease and our treatment recommendations which are of curative intent.  He was encouraged to ask questions that were answered to his stated satisfaction.  At the conclusion of our conversation, the patient is interested in moving forward with brachytherapy and use of  SpaceOAR gel to reduce rectal toxicity from radiotherapy.  We will share our discussion with Dr. Freddi Jaeger and move forward with scheduling his [facility] planning appointment in the near future.  The patient met briefly with Darlin Ehrlich in our office who will be working closely with him to coordinate OR scheduling and pre and post procedure appointments.  We will contact the pharmaceutical rep to ensure that SpaceOAR is available at the time of procedure.  We enjoyed meeting him and his wife, Otelia Blew, today and look forward to continuing to participate in his care.  We personally spent 70 minutes in this encounter including chart review, reviewing radiological studies, meeting face-to-face with the patient, entering orders and completing documentation.    Arta Bihari, PA-C    Kenith Payer, MD  [facility]  Radiation Oncology Direct Dial: [phone]  Fax: [phone] [email or web address]  Skype  LinkedIn   This document serves as a record of services personally performed by Kenith Payer, MD and Keitha Pata, PA-C. It was created on their behalf by Florance Hun, a trained medical scribe. The creation of this record is based on the scribe's personal observations and the provider's statements to them. This document has been checked and approved by the attending provider.

## 2024-04-11 NOTE — Progress Notes (Signed)
 Introduced myself to the patient, and his wife, as the prostate nurse navigator.  No barriers to care identified at this time.  He is here to discuss his radiation treatment options and will proceed with brachytherapy.  I gave him my business card and asked him to call me with questions or concerns.  Verbalized understanding.

## 2024-04-18 ENCOUNTER — Telehealth: Payer: Self-pay | Admitting: *Deleted

## 2024-04-18 NOTE — Telephone Encounter (Signed)
 Called patient to update, spoke with patient.

## 2024-04-21 ENCOUNTER — Other Ambulatory Visit: Payer: Self-pay | Admitting: Urology

## 2024-04-26 ENCOUNTER — Telehealth: Payer: Self-pay | Admitting: *Deleted

## 2024-04-26 NOTE — Telephone Encounter (Signed)
Called patient to inform of pre-seed appts. and implant date, lvm for a return call

## 2024-04-27 ENCOUNTER — Telehealth: Payer: Self-pay | Admitting: *Deleted

## 2024-04-27 NOTE — Telephone Encounter (Signed)
 RETURNED PATIENT'S PHONE CALL, SPOKE WITH PATIENT. ?

## 2024-05-26 NOTE — Progress Notes (Incomplete)
 Pre-seed nursing interview for a diagnosis of Stage T1c adenocarcinoma of the prostate with Gleason score of 4+3, and PSA of 5.66.   Patient identity verified x2.   Patient states issues as follows...  -Pain: *** -Fatigue: *** -Abdomen: *** -Groin: *** -Urinary: *** -Bowels: *** -Appetite: ***  Patient denies all other related issues at this time.  Meaningful use complete.  Urinary Management medication(s)- *** Urology appointment date- ***, with Dr. Aaron Aas at ***  No vitals needed for this visit.  This concludes the interaction.

## 2024-05-27 ENCOUNTER — Other Ambulatory Visit: Payer: Self-pay

## 2024-05-27 ENCOUNTER — Encounter (HOSPITAL_COMMUNITY): Payer: Self-pay

## 2024-05-27 ENCOUNTER — Emergency Department (HOSPITAL_COMMUNITY)
Admission: EM | Admit: 2024-05-27 | Discharge: 2024-05-27 | Disposition: A | Discharge status: Home / Self Care | Attending: Emergency Medicine | Admitting: Emergency Medicine

## 2024-05-27 DIAGNOSIS — Z7982 Long term (current) use of aspirin: Secondary | ICD-10-CM | POA: Insufficient documentation

## 2024-05-27 DIAGNOSIS — H53141 Visual discomfort, right eye: Secondary | ICD-10-CM | POA: Insufficient documentation

## 2024-05-27 DIAGNOSIS — Z7984 Long term (current) use of oral hypoglycemic drugs: Secondary | ICD-10-CM | POA: Diagnosis not present

## 2024-05-27 DIAGNOSIS — Z8546 Personal history of malignant neoplasm of prostate: Secondary | ICD-10-CM | POA: Diagnosis not present

## 2024-05-27 DIAGNOSIS — H539 Unspecified visual disturbance: Secondary | ICD-10-CM

## 2024-05-27 DIAGNOSIS — E119 Type 2 diabetes mellitus without complications: Secondary | ICD-10-CM | POA: Diagnosis not present

## 2024-05-27 NOTE — Discharge Instructions (Signed)
 As discussed, continue taking your steroid pills and eye drops as prescribed. Symptoms should improve over the next several weeks.  Follow up with Dr. Alto Atta on Tuesday at 10 AM for reevaluation. 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 I have attached more information about it in the discharge paperwork.  Get help right away if: You cannot see as much as you did before. You have more redness in one eye or in both eyes. Light bothers your eyes a lot. You have more pain in your eye.

## 2024-05-27 NOTE — ED Provider Notes (Signed)
 [place] EMERGENCY DEPARTMENT AT [facility] Provider Note   CSN: [id number] Arrival date & time: 05/27/24  1732     Patient presents with: Eye Problem   Detric Stejskal is a 78 y.o. male with a history of prostate cancer, diabetes mellitus, cataracts presents the ED today for an eye concern.  Patient reports he is a patient of his right eye for the past several months, without pain or loss of vision.  He has been seen by his optometrist for this twice, the first time he was prescribed eyedrops that caused him to have photophobia.  He was reevaluated 2 days ago and given a Medrol Dosepak and prednisolone eyedrops to take for the photophobia.  He started these medications 24 hours ago and have improvement of photophobia but is still having the same haziness in the right eye.  Vision has not worsened.  He was reading the side effects of the medications he was prescribed along with side effects.  He is worried that he is going to lose his vision second to the new medications.     Prior to Admission medications   Medication Sig Start Date End Date Taking? Authorizing Provider  Accu-Chek Softclix Lancets lancets SMARTSIG:1 Topical Daily 03/11/24   [provider]  albuterol  (VENTOLIN  HFA) 108 (90 Base) MCG/ACT inhaler Inhale 1-2 puffs into the lungs every 6 (six) hours as needed for wheezing or shortness of breath. 03/23/22   Conklin, Erica R, PA-C  APPLE CIDER VINEGAR PO Take 1 capsule by mouth daily.    [provider]  Ascorbic Acid (VITAMIN C) 100 MG tablet Take 100 mg by mouth daily.    [provider]  aspirin EC 325 MG tablet Take 325 mg by mouth daily.    [provider]  atorvastatin  (LIPITOR) 40 MG tablet Take 40 mg by mouth at bedtime. 06/24/19   [provider]  Blood Glucose Monitoring Suppl (ACCU-CHEK GUIDE) w/Device KIT  04/06/24   [provider]  Chromium 1 MG CAPS Take 1 mg by mouth daily.    [provider]   Cinnamon 500 MG capsule Take 500 mg by mouth daily.    [provider]  Coenzyme Q10 10 MG capsule Take 100 mg by mouth.    [provider]  ezetimibe (ZETIA) 10 MG tablet Take 10 mg by mouth daily.    [provider]  glucosamine-chondroitin 500-400 MG tablet Take 1 tablet by mouth. 05/21/22   [provider]  hydrochlorothiazide (HYDRODIURIL) 25 MG tablet Take 25 mg by mouth daily.     [provider]  ipratropium (ATROVENT) 0.06 % nasal spray 1 spray. 07/27/19   [provider]  Lactobacillus Rhamnosus, GG, (CULTURELLE) CAPS Take 1 capsule by mouth daily. 05/21/22   [provider]  lidocaine  (LIDODERM ) 5 % Place 1 patch onto the skin daily. Remove & Discard patch within 12 hours or as directed by MD 01/16/22   Corena Devon, PA-C  lisinopril  (ZESTRIL ) 5 MG tablet Take 5 mg by mouth daily.  02/19/19   [provider]  Magnesium  Oxide -Mg Supplement 250 MG TABS Take 1 tablet by mouth daily. 11/05/23   [provider]  metFORMIN (GLUCOPHAGE) 500 MG tablet Take 500 mg by mouth 2 (two) times daily with a meal.     [provider]  methocarbamol  (ROBAXIN ) 500 MG tablet Take 1 tablet (500 mg total) by mouth 2 (two) times daily. 01/16/22   Khatri, Hina, PA-C  montelukast (  SINGULAIR) 10 MG tablet Take 10 mg by mouth. 04/14/19   [provider]  niacin 100 MG tablet Take 100 mg by mouth at bedtime.    [provider]  OIL OF OREGANO PO Take by mouth.    [provider]  Omega-3 Fatty Acids (FISH OIL) 1000 MG CAPS Take 1 capsule by mouth daily.    [provider]  polyethylene glycol (MIRALAX  / GLYCOLAX ) 17 g packet Take 17 g by mouth 2 (two) times daily. 09/14/19   Pokhrel, Laxman, MD  saw palmetto 160 MG capsule Take 160 mg by mouth daily.    [provider]    Allergies: Patient has no known allergies.    Review of Systems  Eyes:  Positive for visual disturbance.  All other  systems reviewed and are negative.   Updated Vital Signs BP (!) 157/64   Pulse 66   Temp 98.6 F (37 C) (Oral)   Resp 16   Ht 6' 1 (1.854 m)   Wt 106.6 kg   SpO2 98%   BMI 31.00 kg/m   Physical Exam Vitals and nursing note reviewed.  Constitutional:      General: He is not in acute distress.    Appearance: Normal appearance.  HENT:     Head: Normocephalic and atraumatic.     Mouth/Throat:     Mouth: Mucous membranes are moist.   Eyes:     General:        Right eye: No discharge.        Left eye: No discharge.     Extraocular Movements: Extraocular movements intact.     Conjunctiva/sclera: Conjunctivae normal.     Pupils: Pupils are equal, round, and reactive to light.     Comments: EOM intact.  Left eye: 20/20 Right eye: 20/25 Bilateral: 20/20  Pulmonary:     Effort: Pulmonary effort is normal.  Abdominal:     Palpations: Abdomen is soft.     Tenderness: There is no abdominal tenderness.   Skin:    General: Skin is warm and dry.     Findings: No rash.   Neurological:     General: No focal deficit present.     Mental Status: He is alert.   Psychiatric:        Mood and Affect: Mood normal.        Behavior: Behavior normal.    (all labs ordered are listed, but only abnormal results are displayed) Labs Reviewed - No data to display  EKG: None  Radiology: No results found.   Procedures   Medications Ordered in the ED - No data to display                                  Medical Decision Making  This patient presents to the ED for concern of eye problem, this involves an extensive number of treatment options, and is a complaint that carries with it a high risk of complications and morbidity.   Differential diagnosis includes: uveitis, retinal detachment, glaucoma, cataracts, etc. Since symptoms have been going on for months and has been evaluated multiple times by his optometrist - symptoms have not worsened since  onset.   Comorbidities  See HPI above   Additional History  Additional history obtained from prior records   Consultations  I requested consultation with Dr. Alto Atta with ophthalmology,  and discussed lab and imaging findings as well  as pertinent plan - they recommend: Patient can continue both the oral and eye drop steroids.  Patient can follow-up in the office on Tuesday at 10 AM with Dr. Alto Atta for further evaluation.   Problem List / ED Course / Critical Interventions / Medication Management  Patient has hazy vision in the right eye which has been having for the past couple months.  She has been seeing ophthalmologist twice previously already.  Vision changes has not worsened.  Denies any pain in the eye. Patient was started on oral steroids and steroid eyedrops by ophthalmologist 2 days ago.  He started taking them last night.  Reports that the photophobia has been having has improved but I have reviewed the patients home medicines and have made adjustments as needed   Social Determinants of Health  Access to healthcare   Test / Admission - Considered  Patient is stable and safe discharge home. Return precautions given.    Final diagnoses:  Vision changes    ED Discharge Orders     None          Sonnie Dusky, PA-C 05/27/24 2016    Deatra Face, MD 05/27/24 2138

## 2024-05-27 NOTE — ED Triage Notes (Signed)
 Pt to ED via [facility] from [facility] C/O ongoing right eye problem x 1 month, reports pain at eye , pt has been evaluated by eye doctor for the same multiple times;  started on eye drops with no relief. Reports mild sensitivity to light and mild vision changes. Started on eye drop and steroid yesterday.

## 2024-05-31 ENCOUNTER — Telehealth: Payer: Self-pay | Admitting: *Deleted

## 2024-05-31 NOTE — Progress Notes (Signed)
  Radiation Oncology         [phone] ________________________________  Name: [facility] MRN: [id number]  Date: 06/01/2024  DOB: [birth year]  SIMULATION AND TREATMENT PLANNING NOTE PUBIC ARCH STUDY  WU:JWJXBJ, Mobolaji B, MD  Bakare, Mobolaji B, MD  DIAGNOSIS: 78 y.o. gentleman with Stage T1c adenocarcinoma of the prostate with Gleason score of 4+3, and PSA of 5.66.   Oncology History  Malignant neoplasm of prostate (HCC)  03/07/2024 Cancer Staging   Staging form: Prostate, AJCC 8th Edition - Clinical stage from 03/07/2024: Stage IIC (cT1c, cN0, cM0, PSA: 5.7, Grade Group: 3) - Signed by Keitha Pata, PA-C on 04/11/2024 Histopathologic type: Adenocarcinoma, NOS Stage prefix: Initial diagnosis Prostate specific antigen (PSA) range: Less than 10 Gleason primary pattern: 4 Gleason secondary pattern: 3 Gleason score: 7 Histologic grading system: 5 grade system Number of biopsy cores examined: 21 Number of biopsy cores positive: 12 Location of positive needle core biopsies: Both sides   04/11/2024 Initial Diagnosis   Malignant neoplasm of prostate (HCC)       ICD-10-CM   1. Malignant neoplasm of prostate (HCC)  C61       COMPLEX SIMULATION:  The patient presented today for evaluation for possible prostate seed implant. He was brought to the radiation planning suite and placed supine on the CT couch. A 3-dimensional image study set was obtained in upload to the planning computer. There, on each axial slice, I contoured the prostate gland. Then, using three-dimensional radiation planning tools I reconstructed the prostate in view of the structures from the transperineal needle pathway to assess for possible pubic arch interference. In doing so, I did not appreciate any pubic arch interference. Also, the patient's prostate volume was estimated based on the drawn structure. The volume was 32 cc.  Given the pubic arch appearance and prostate volume, patient remains a good candidate to  proceed with prostate seed implant. Today, he freely provided informed written consent to proceed.    PLAN: The patient will undergo prostate seed implant.   ________________________________  Trilby Fujisawa. Lorri Rota, M.D.

## 2024-05-31 NOTE — Telephone Encounter (Signed)
 CALLED PATIENT TO REMIND OF PRE-SEED APPTS. FOR 06-01-24, SPOKE WITH PATIENT AND HE IS AWARE OF THESE APPTS.

## 2024-06-01 ENCOUNTER — Encounter: Payer: Self-pay | Admitting: Radiation Oncology

## 2024-06-01 ENCOUNTER — Ambulatory Visit
Admission: RE | Admit: 2024-06-01 | Discharge: 2024-06-01 | Disposition: A | Discharge status: Home / Self Care | Payer: Self-pay | Source: Ambulatory Visit | Attending: Urology | Admitting: Urology

## 2024-06-01 ENCOUNTER — Ambulatory Visit
Admission: RE | Admit: 2024-06-01 | Discharge: 2024-06-01 | Disposition: A | Discharge status: Home / Self Care | Source: Ambulatory Visit | Attending: Radiation Oncology | Admitting: Radiation Oncology

## 2024-06-01 DIAGNOSIS — C61 Malignant neoplasm of prostate: Secondary | ICD-10-CM | POA: Diagnosis present

## 2024-06-01 NOTE — Progress Notes (Signed)
 Radiation Oncology         [phone] ________________________________  Outpatient Follow up- Pre-seed visit  Name: [facility] MRN: [id number]  Date: 06/01/2024  DOB: 19-Jan-1946  CC:Nohemi Batters, MD  Lahoma Pigg, MD   REFERRING PHYSICIAN: Lahoma Pigg, MD  DIAGNOSIS: 78 y.o. gentleman with Stage T1c adenocarcinoma of the prostate with Gleason score of 4+3, and PSA of 5.66.     ICD-10-CM   1. Malignant neoplasm of prostate (HCC)  C61       HISTORY OF PRESENT ILLNESS: [facility] Spencer is a 78 y.o. male with a diagnosis of prostate cancer. He was initially referred to Dr. Annitta Kindler back in 2023 for an elevated PSA of 4.03, digital rectal examination at that time was without concerning findings or discrete nodules. His PSA was monitored closely and fluctuated, but overall, remained stable through 05/2023. The PSA increased to 5.66 in 11/2023, prompting a prostate MRI on 01/15/24 that showed a PI-RADS 4 lesion in the right anterior transition zone and right anterior fibromuscular stroma and two PI-RADS 3 lesions in the peripheral zone. The patient met with Dr. Freddi Jaeger in 11/2023 and proceeded to MRI fusion biopsy of the prostate on 03/07/24, digital rectal examination performed at that time showed no nodules.  The prostate volume measured 32 cc.  Out of 21 core biopsies, 15 were positive.  The maximum Gleason score was 4+3, and this was seen in the left apex. Additionally, Gleason 3+4 was seen in 1 of 3 samples from the MRI ROI #2 on the right, and in the right mid, right apex, and right mid lateral, and Gleason 3+3 in the right apex lateral (small focus), left mid (small focus), 3 of 3 samples from MRI ROI on the left, and 2 of 3 samples from the MRI ROI #2 on the right (small focus).   He underwent staging PSMA PET scan on 04/03/24 showing no evidence of disease outside of the prostate.  The patient reviewed the biopsy results with his urologist and was kindly referred to us  for discussion of  potential radiation treatment options. We initially met the patient on 04/11/2024 and he was most interested in proceeding with brachytherapy and SpaceOAR gel placement for treatment of his disease. He is here today for his pre-procedure imaging for planning and to answer any additional questions he may have about this treatment.   PREVIOUS RADIATION THERAPY: No  PAST MEDICAL HISTORY:  Past Medical History:  Diagnosis Date   Diabetes mellitus without complication (HCC)    Epididymitis 02/13/2018   High cholesterol    Hypertension       PAST SURGICAL HISTORY: Past Surgical History:  Procedure Laterality Date   INGUINAL HERNIA REPAIR     As infant   LUMBAR SPINE SURGERY  2009   discectomy   LUMBAR SPINE SURGERY  2015   enlarged nerve canal    PROSTATE BIOPSY     TONSILLECTOMY      FAMILY HISTORY: No family history on file.  SOCIAL HISTORY:  Social History   Socioeconomic History   Marital status: Married    Spouse name: Not on file   Number of children: Not on file   Years of education: Not on file   Highest education level: Not on file  Occupational History   Not on file  Tobacco Use   Smoking status: Never   Smokeless tobacco: Never  Vaping Use   Vaping status: Never Used  Substance and Sexual Activity   Alcohol  use: Not Currently    Comment: rarely   Drug use: Never   Sexual activity: Not on file  Other Topics Concern   Not on file  Social History Narrative   Not on file   Social Drivers of Health   Financial Resource Strain: Not on file  Food Insecurity: No Food Insecurity (04/11/2024)   Hunger Vital Sign    Worried About Running Out of Food in the Last Year: Never true    Ran Out of Food in the Last Year: Never true  Transportation Needs: No Transportation Needs (04/11/2024)   PRAPARE - Administrator, Civil Service (Medical): No    Lack of Transportation (Non-Medical): No  Physical Activity: Not on file  Stress: Not on file  Social  Connections: Not on file  Intimate Partner Violence: Not At Risk (04/11/2024)   Humiliation, Afraid, Rape, and Kick questionnaire    Fear of Current or Ex-Partner: No    Emotionally Abused: No    Physically Abused: No    Sexually Abused: No    ALLERGIES: Patient has no known allergies.  MEDICATIONS:  Current Outpatient Medications  Medication Sig Dispense Refill   Accu-Chek Softclix Lancets lancets SMARTSIG:1 Topical Daily     albuterol  (VENTOLIN  HFA) 108 (90 Base) MCG/ACT inhaler Inhale 1-2 puffs into the lungs every 6 (six) hours as needed for wheezing or shortness of breath. 6.7 each 0   APPLE CIDER VINEGAR PO Take 1 capsule by mouth daily.     Ascorbic Acid (VITAMIN C) 100 MG tablet Take 100 mg by mouth daily.     aspirin EC 325 MG tablet Take 325 mg by mouth daily.     atorvastatin  (LIPITOR) 40 MG tablet Take 40 mg by mouth at bedtime.     Blood Glucose Monitoring Suppl (ACCU-CHEK GUIDE) w/Device KIT      Chromium 1 MG CAPS Take 1 mg by mouth daily.     Cinnamon 500 MG capsule Take 500 mg by mouth daily.     Coenzyme Q10 10 MG capsule Take 100 mg by mouth.     docusate sodium  (COLACE) 100 MG capsule Take 100 mg by mouth 2 (two) times daily.     ezetimibe (ZETIA) 10 MG tablet Take 10 mg by mouth daily.     glucosamine-chondroitin 500-400 MG tablet Take 1 tablet by mouth.     hydrochlorothiazide (HYDRODIURIL) 25 MG tablet Take 25 mg by mouth daily.      ipratropium (ATROVENT) 0.06 % nasal spray 1 spray.     Lactobacillus Rhamnosus, GG, (CULTURELLE) CAPS Take 1 capsule by mouth daily.     lidocaine  (LIDODERM ) 5 % Place 1 patch onto the skin daily. Remove & Discard patch within 12 hours or as directed by MD 30 patch 0   lisinopril  (ZESTRIL ) 5 MG tablet Take 5 mg by mouth daily.      Magnesium  Oxide -Mg Supplement 250 MG TABS Take 1 tablet by mouth daily.     metFORMIN (GLUCOPHAGE) 500 MG tablet Take 500 mg by mouth 2 (two) times daily with a meal.      methocarbamol  (ROBAXIN ) 500 MG  tablet Take 1 tablet (500 mg total) by mouth 2 (two) times daily. 20 tablet 0   montelukast (SINGULAIR) 10 MG tablet Take 10 mg by mouth.     niacin 100 MG tablet Take 100 mg by mouth at bedtime.     OIL OF OREGANO PO Take by mouth.     Omega-3 Fatty  Acids (FISH OIL) 1000 MG CAPS Take 1 capsule by mouth daily.     polyethylene glycol (MIRALAX  / GLYCOLAX ) 17 g packet Take 17 g by mouth 2 (two) times daily. 14 each 0   prednisoLONE acetate (PRED FORTE) 1 % ophthalmic suspension Place 1 drop into the right eye 3 (three) times daily.     saccharomyces boulardii (FLORASTOR) 250 MG capsule 1 capsule.     saw palmetto 160 MG capsule Take 160 mg by mouth daily.     valACYclovir (VALTREX) 1000 MG tablet Take 1 g by mouth 3 (three) times daily.     No current facility-administered medications for this encounter.    REVIEW OF SYSTEMS:  On review of systems, the patient reports that he is doing well overall. He denies any chest pain, shortness of breath, cough, fevers, chills, night sweats, unintended weight changes. He denies any bowel disturbances, and denies abdominal pain, nausea or vomiting. He denies any new musculoskeletal or joint aches or pains. His IPSS was 8, indicating mild urinary symptoms. His SHIM was 24, indicating he does not have erectile dysfunction. A complete review of systems is obtained and is otherwise negative.    PHYSICAL EXAM:  Wt Readings from Last 3 Encounters:  05/27/24 235 lb (106.6 kg)  04/11/24 239 lb 9.6 oz (108.7 kg)  07/26/23 215 lb (97.5 kg)   Temp Readings from Last 3 Encounters:  05/27/24 98.6 F (37 C) (Oral)  04/11/24 98 F (36.7 C) (Oral)  07/26/23 98 F (36.7 C)   BP Readings from Last 3 Encounters:  05/27/24 (!) 157/64  04/11/24 (!) 186/75  07/26/23 (!) 164/58   Pulse Readings from Last 3 Encounters:  05/27/24 66  04/11/24 (!) 57  07/26/23 (!) 56    /10  In general this is a well appearing African-American male in no acute distress. He's  alert and oriented x4 and appropriate throughout the examination. Cardiopulmonary assessment is negative for acute distress, and he exhibits normal effort.     KPS = 100  100 - Normal; no complaints; no evidence of disease. 90   - Able to carry on normal activity; minor signs or symptoms of disease. 80   - Normal activity with effort; some signs or symptoms of disease. 69   - Cares for self; unable to carry on normal activity or to do active work. 60   - Requires occasional assistance, but is able to care for most of his personal needs. 50   - Requires considerable assistance and frequent medical care. 40   - Disabled; requires special care and assistance. 30   - Severely disabled; hospital admission is indicated although death not imminent. 20   - Very sick; hospital admission necessary; active supportive treatment necessary. 10   - Moribund; fatal processes progressing rapidly. 0     - Dead  Karnofsky DA, Abelmann WH, Craver LS and Burchenal [facility] [phone]) The use of the nitrogen mustards in the palliative treatment of carcinoma: with particular reference to bronchogenic carcinoma Cancer 1 634-56  LABORATORY DATA:  Lab Results  Component Value Date   WBC 6.5 02/05/2023   HGB 12.6 (L) 02/05/2023   HCT 37.3 (L) 02/05/2023   MCV 89.9 02/05/2023   PLT 194 02/05/2023   Lab Results  Component Value Date   NA 138 02/05/2023   K 4.5 02/05/2023   CL 103 02/05/2023   CO2 29 02/05/2023   Lab Results  Component Value Date   ALT 30 02/05/2023  AST 30 02/05/2023   ALKPHOS 51 02/05/2023   BILITOT 0.5 02/05/2023     RADIOGRAPHY: No results found.    IMPRESSION/PLAN: 1. 78 y.o. gentleman with Stage T1c adenocarcinoma of the prostate with Gleason score of 4+3, and PSA of 5.66.  The patient has elected to proceed with seed implant for treatment of his disease. We reviewed the risks, benefits, short and long-term effects associated with brachytherapy and discussed the role of SpaceOAR in  reducing the rectal toxicity associated with radiotherapy.  He appears to have a good understanding of his disease and our treatment recommendations which are of curative intent.  He was encouraged to ask questions that were answered to his stated satisfaction. He has freely signed written consent to proceed today in the office and a copy of this document will be placed in his medical record. His procedure is tentatively scheduled for 06/30/2024 in collaboration with Dr. Freddi Jaeger and we will see him back for his post-procedure visit approximately 3 weeks thereafter. We look forward to continuing to participate in his care. He knows that he is welcome to call with any questions or concerns at any time in the interim.  I personally spent 30 minutes in this encounter including chart review, reviewing radiological studies, meeting face-to-face with the patient, entering orders and completing documentation.    Arta Bihari, MMS, PA-C [facility] Radiation Oncology Physician Assistant Direct Dial: [phone]  Fax: [phone]

## 2024-06-01 NOTE — Progress Notes (Signed)
 Pre-seed nursing interview for a diagnosis of Stage T1c adenocarcinoma of the prostate with Gleason score of 4+3, and PSA of 5.66.   Patient identity verified x2.   Patient states issues as follows...  -Pain: No -Fatigue: Yes -Abdomen: No -Groin: No -Urinary: No -Bowels: No -Appetite: Good  Patient denies all other related issues at this time.  Meaningful use complete.  Urinary Management medication(s)-  No Urology appointment date- 06/20/2024 pre-admin testing.  No vitals needed for this visit.  Wt. 233.8.  This concludes the interaction.

## 2024-06-19 NOTE — Progress Notes (Addendum)
 COVID Vaccine Completed: yes  Date of COVID positive in last 90 days:  PCP - Sharilyn Charleston, MD Cardiologist - n/a  PET- 04/03/24 Epic Chest x-ray - n/a EKG - 06/20/24 Epic/chart Stress Test - 10/06/18 Epic ECHO - 10/06/18 Epic Cardiac Cath - n/a Pacemaker/ICD device last checked: n/a Spinal Cord Stimulator: n/a  Bowel Prep - fleet enema, 1 hour before surgery  Sleep Study - n/a CPAP -   Fasting Blood Sugar - less than 140 Checks Blood Sugar not very often  Last dose of GLP1 agonist-  N/A GLP1 instructions:  Do not take after     Last dose of SGLT-2 inhibitors-  N/A SGLT-2 instructions:  Do not take after     Blood Thinner Instructions:  Last dose:   Time: Aspirin Instructions: ASA 325, hold ASA 7 days Last Dose:  Activity level: Can go up a flight of stairs and perform activities of daily living without stopping and without symptoms of chest pain or shortness of breath.  Anesthesia review: sinus brady on EKG at 54, HTN, DM2, syncope   Patient denies shortness of breath, fever, cough and chest pain at PAT appointment  Patient verbalized understanding of instructions that were given to them at the PAT appointment. Patient was also instructed that they will need to review over the PAT instructions again at home before surgery.

## 2024-06-20 ENCOUNTER — Encounter (HOSPITAL_COMMUNITY): Payer: Self-pay

## 2024-06-20 ENCOUNTER — Other Ambulatory Visit: Payer: Self-pay

## 2024-06-20 ENCOUNTER — Encounter (HOSPITAL_COMMUNITY)
Admission: RE | Admit: 2024-06-20 | Discharge: 2024-06-20 | Disposition: A | Discharge status: Home / Self Care | Source: Ambulatory Visit | Attending: Urology | Admitting: Urology

## 2024-06-20 VITALS — BP 145/70 | HR 50 | Temp 98.6°F | Resp 16 | Ht 73.0 in | Wt 232.0 lb

## 2024-06-20 DIAGNOSIS — Z01818 Encounter for other preprocedural examination: Secondary | ICD-10-CM | POA: Insufficient documentation

## 2024-06-20 DIAGNOSIS — E1100 Type 2 diabetes mellitus with hyperosmolarity without nonketotic hyperglycemic-hyperosmolar coma (NKHHC): Secondary | ICD-10-CM | POA: Insufficient documentation

## 2024-06-20 HISTORY — DX: Post-traumatic stress disorder, unspecified: F43.10

## 2024-06-20 HISTORY — DX: Migraine, unspecified, not intractable, without status migrainosus: G43.909

## 2024-06-20 LAB — CBC
HCT: 38.8 % — ABNORMAL LOW (ref 39.0–52.0)
Hemoglobin: 12.8 g/dL — ABNORMAL LOW (ref 13.0–17.0)
MCH: 30.3 pg (ref 26.0–34.0)
MCHC: 33 g/dL (ref 30.0–36.0)
MCV: 91.9 fL (ref 80.0–100.0)
Platelets: 188 K/uL (ref 150–400)
RBC: 4.22 MIL/uL (ref 4.22–5.81)
RDW: 15.5 % (ref 11.5–15.5)
WBC: 5.5 K/uL (ref 4.0–10.5)
nRBC: 0 % (ref 0.0–0.2)

## 2024-06-20 LAB — BASIC METABOLIC PANEL WITH GFR
Anion gap: 8 (ref 5–15)
BUN: 12 mg/dL (ref 8–23)
CO2: 27 mmol/L (ref 22–32)
Calcium: 9.6 mg/dL (ref 8.9–10.3)
Chloride: 100 mmol/L (ref 98–111)
Creatinine, Ser: 0.67 mg/dL (ref 0.61–1.24)
GFR, Estimated: >60 mL/min (ref >60)
Glucose, Bld: 126 mg/dL — ABNORMAL HIGH (ref 70–99)
Potassium: 3.9 mmol/L (ref 3.5–5.1)
Sodium: 135 mmol/L (ref 135–145)

## 2024-06-20 LAB — GLUCOSE, CAPILLARY: Glucose-Capillary: 159 mg/dL — ABNORMAL HIGH (ref 70–99)

## 2024-06-20 LAB — HEMOGLOBIN A1C
Hgb A1c MFr Bld: 7 % — ABNORMAL HIGH (ref 4.8–5.6)
Mean Plasma Glucose: 154.2 mg/dL

## 2024-06-20 NOTE — Patient Instructions (Signed)
 SURGICAL WAITING ROOM VISITATION  Patients having surgery or a procedure may have no more than 2 support people in the waiting area - these visitors may rotate.    Children under the age of 62 must have an adult with them who is not the patient.  Visitors with respiratory illnesses are discouraged from visiting and should remain at home.  If the patient needs to stay at the hospital during part of their recovery, the visitor guidelines for inpatient rooms apply. Pre-op nurse will coordinate an appropriate time for 1 support person to accompany patient in pre-op.  This support person may not rotate.    Please refer to the [facility] website for the visitor guidelines for Inpatients (after your surgery is over and you are in a regular room).    Your procedure is scheduled on: 06/30/24   Report to [facility] Main Entrance    Report to admitting at 10:45 AM   Call this number if you have problems the morning of surgery [phone]   Do not eat food or drink liquids :After Midnight.          If you have questions, please contact your surgeon's office.   FOLLOW BOWEL PREP AND ANY ADDITIONAL PRE OP INSTRUCTIONS YOU RECEIVED FROM YOUR SURGEON'S OFFICE!!!     Oral Hygiene is also important to reduce your risk of infection.                                    Remember - BRUSH YOUR TEETH THE MORNING OF SURGERY WITH YOUR REGULAR TOOTHPASTE  DENTURES WILL BE REMOVED PRIOR TO SURGERY PLEASE DO NOT APPLY Poly grip OR ADHESIVES!!!   Stop all vitamins and herbal supplements 7 days before surgery.   Take these medicines the morning of surgery with A SIP OF WATER:  Tylenol , Eye drops, Atorvastatin , nasal spray  DO NOT TAKE ANY ORAL DIABETIC MEDICATIONS DAY OF YOUR SURGERY  How to Manage Your Diabetes Before and After Surgery  Why is it important to control my blood sugar before and after surgery? Improving blood sugar levels before and after surgery helps healing and can limit  problems. A way of improving blood sugar control is eating a healthy diet by:  Eating less sugar and carbohydrates  Increasing activity/exercise  Talking with your doctor about reaching your blood sugar goals High blood sugars (greater than 180 mg/dL) can raise your risk of infections and slow your recovery, so you will need to focus on controlling your diabetes during the weeks before surgery. Make sure that the doctor who takes care of your diabetes knows about your planned surgery including the date and location.  How do I manage my blood sugar before surgery? Check your blood sugar at least 4 times a day, starting 2 days before surgery, to make sure that the level is not too high or low. Check your blood sugar the morning of your surgery when you wake up and every 2 hours until you get to the Short Stay unit. If your blood sugar is less than 70 mg/dL, you will need to treat for low blood sugar: Do not take insulin . Treat a low blood sugar (less than 70 mg/dL) with  cup of clear juice (cranberry or apple), 4 glucose tablets, OR glucose gel. Recheck blood sugar in 15 minutes after treatment (to make sure it is greater than 70 mg/dL). If your blood sugar is  not greater than 70 mg/dL on recheck, call [phone] for further instructions. Report your blood sugar to the short stay nurse when you get to Short Stay.  If you are admitted to the hospital after surgery: Your blood sugar will be checked by the staff and you will probably be given insulin  after surgery (instead of oral diabetes medicines) to make sure you have good blood sugar levels. The goal for blood sugar control after surgery is 80-180 mg/dL.   WHAT DO I DO ABOUT MY DIABETES MEDICATION?  Do not take oral diabetes medicines (pills) the morning of surgery.  THE DAY BEFORE SURGERY, take Metformin as prescribed.      THE MORNING OF SURGERY, do not take Metformin.   Reviewed and Endorsed by [facility] Patient Education  Committee, August 2015                              You may not have any metal on your body including jewelry, and body piercing             Do not wear lotions, powders, cologne, or deodorant              Men may shave face and neck.   Do not bring valuables to the hospital. [facility] IS NOT             RESPONSIBLE   FOR VALUABLES.   Contacts, glasses, dentures or bridgework may not be worn into surgery.  DO NOT BRING YOUR HOME MEDICATIONS TO THE HOSPITAL. PHARMACY WILL DISPENSE MEDICATIONS LISTED ON YOUR MEDICATION LIST TO YOU DURING YOUR ADMISSION IN THE HOSPITAL!    Patients discharged on the day of surgery will not be allowed to drive home.  Someone NEEDS to stay with you for the first 24 hours after anesthesia.              Please read over the following fact sheets you were given: IF YOU HAVE QUESTIONS ABOUT YOUR PRE-OP INSTRUCTIONS PLEASE CALL [phone] [facility]   If you received a COVID test during your pre-op visit  it is requested that you wear a mask when out in public, stay away from anyone that may not be feeling well and notify your surgeon if you develop symptoms. If you test positive for Covid or have been in contact with anyone that has tested positive in the last 10 days please notify you surgeon.    [facility] - Preparing for Surgery Before surgery, you can play an important role.  Because skin is not sterile, your skin needs to be as free of germs as possible.  You can reduce the number of germs on your skin by washing with CHG (chlorahexidine gluconate) soap before surgery.  CHG is an antiseptic cleaner which kills germs and bonds with the skin to continue killing germs even after washing. Please DO NOT use if you have an allergy to CHG or antibacterial soaps.  If your skin becomes reddened/irritated stop using the CHG and inform your nurse when you arrive at Short Stay. Do not shave (including legs and underarms) for at least 48 hours prior to the first CHG  shower.  You may shave your face/neck.  Please follow these instructions carefully:  1.  Shower with CHG Soap the night before surgery and the  morning of surgery.  2.  If you choose to wash your hair, wash your hair first as usual with your  normal  shampoo.  3.  After you shampoo, rinse your hair and body thoroughly to remove the shampoo.                             4.  Use CHG as you would any other liquid soap.  You can apply chg directly to the skin and wash.  Gently with a scrungie or clean washcloth.  5.  Apply the CHG Soap to your body ONLY FROM THE NECK DOWN.   Do   not use on face/ open                           Wound or open sores. Avoid contact with eyes, ears mouth and   genitals (private parts).                       Wash face,  Genitals (private parts) with your normal soap.             6.  Wash thoroughly, paying special attention to the area where your    surgery  will be performed.  7.  Thoroughly rinse your body with warm water from the neck down.  8.  DO NOT shower/wash with your normal soap after using and rinsing off the CHG Soap.                9.  Pat yourself dry with a clean towel.            10.  Wear clean pajamas.            11.  Place clean sheets on your bed the night of your first shower and do not  sleep with pets. Day of Surgery : Do not apply any lotions/deodorants the morning of surgery.  Please wear clean clothes to the hospital/surgery center.  FAILURE TO FOLLOW THESE INSTRUCTIONS MAY RESULT IN THE CANCELLATION OF YOUR SURGERY  PATIENT SIGNATURE_________________________________  NURSE SIGNATURE__________________________________  ________________________________________________________________________

## 2024-06-26 ENCOUNTER — Encounter (HOSPITAL_COMMUNITY): Payer: Self-pay

## 2024-06-26 NOTE — Progress Notes (Signed)
 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 Followed by optometry and ophthalmology.   Pt had cardiac eval for syncope in 2019. Had outpatient cardiac monitoring, stress test, and echo which were normal.  Follows with PCP at [facility] and the [place]. Last seen in Jan 2025. All issues stable. A1c 7.0. BP controlled.  VS: BP (!) 145/70   Pulse (!) 50   Temp 37 C (Oral)   Resp 16   Ht 6' 1 (1.854 m)   Wt 105.2 kg   SpO2 99%   BMI 30.61 kg/m   PROVIDERS: Roanna Ezekiel NOVAK, MD [facility] [place])   LABS: Labs reviewed: Acceptable for surgery. (all labs ordered are listed, but only abnormal results are displayed)  Labs Reviewed  HEMOGLOBIN A1C - Abnormal; Notable for the following components:      Result Value   Hgb A1c MFr Bld 7.0 (*)    All other components within normal limits  BASIC METABOLIC PANEL WITH GFR - Abnormal; Notable for the following components:   Glucose, Bld 126 (*)    All other components within normal limits  CBC - Abnormal; Notable for the following components:   Hemoglobin 12.8 (*)    HCT 38.8 (*)    All other components within normal limits  GLUCOSE, CAPILLARY - Abnormal; Notable for the following components:   Glucose-Capillary 159 (*)    All other components within normal limits     IMAGES:  PET scan 04/03/24:  IMPRESSION: 1. Nonspecific heterogeneous tracer affinity throughout the prostate. 2. No tracer avid metastatic disease. 3.  Aortic Atherosclerosis (ICD10-I70.0).  EKG  06/20/24:  Sinus bradycardia, rate 54 Left axis deviation Low voltage QRS Abnormal ECG  CV:  Cardiac monitor 10/06/2018:  Sinus braycardia, Normal sinus rhythm and sinus tachycardia. The average heart rate was 55bpm. The heart rate ranged from 39 to 119bpm with most bradycardia occurring during sleep.  Stress test 10/06/2018:  Nuclear stress EF: 54%. Blood pressure demonstrated a hypertensive response to exercise. There was no ST segment deviation noted during stress. Defect 1: There is a medium defect of moderate severity present in the basal inferior, mid inferior, apical inferior and apex location. This is a low risk study. The left ventricular ejection fraction is mildly decreased (45-54%).   Low risk probably normal stress nuclear study with inferior and apical thinning but no significant ischemia.  Patient did have a hypertensive response to exercise.  Gated ejection fraction 54% with normal wall motion  Echo 10/02/2018:  Study Conclusions  - Left ventricle: The cavity size was normal. There was mild focal   basal hypertrophy of the septum. Systolic function was normal.   The estimated ejection fraction was in the range of 60% to 65%.   Wall motion was normal; there were no regional wall motion   abnormalities. Left ventricular diastolic function parameters   were normal.  Impressions:  - Normal LV systolic function; no significant valvular disease.  Past Medical History:  Diagnosis  Date   Diabetes mellitus without complication (HCC)    Epididymitis 02/13/2018   High cholesterol    Hypertension     Past Surgical History:  Procedure Laterality Date   CATARACT EXTRACTION Bilateral    INGUINAL HERNIA REPAIR     As infant   LUMBAR SPINE SURGERY  2009   discectomy   LUMBAR SPINE SURGERY  2015   enlarged nerve canal    PROSTATE BIOPSY     TONSILLECTOMY      MEDICATIONS:  Accu-Chek Softclix Lancets lancets   acetaminophen  (TYLENOL ) 500 MG tablet   albuterol   (VENTOLIN  HFA) 108 (90 Base) MCG/ACT inhaler   APPLE CIDER VINEGAR PO   Ascorbic Acid (VITAMIN C PO)   aspirin EC 325 MG tablet   atorvastatin  (LIPITOR) 40 MG tablet   Blood Glucose Monitoring Suppl (ACCU-CHEK GUIDE) w/Device KIT   carboxymethylcellulose (REFRESH PLUS) 0.5 % SOLN   Cholecalciferol (VITAMIN D3) 50 MCG (2000 UT) TABS   Cinnamon 500 MG capsule   COENZYME Q10 PO   docusate sodium  (COLACE) 100 MG capsule   ezetimibe (ZETIA) 10 MG tablet   fluticasone (FLONASE) 50 MCG/ACT nasal spray   Glucosamine-Chondroitin (COSAMIN DS PO)   hydrochlorothiazide (HYDRODIURIL) 25 MG tablet   ipratropium (ATROVENT) 0.06 % nasal spray   Iron-Vitamin C (VITRON-C) 65-125 MG TABS   lidocaine  (LIDODERM ) 5 %   lisinopril  (ZESTRIL ) 5 MG tablet   Magnesium  Oxide -Mg Supplement 250 MG TABS   metFORMIN (GLUCOPHAGE) 500 MG tablet   niacin (,VITAMIN B3,) 500 MG tablet   OIL OF OREGANO PO   Omega-3 Fatty Acids (FISH OIL PO)   polyethylene glycol (MIRALAX  / GLYCOLAX ) 17 g packet   prednisoLONE acetate (PRED FORTE) 1 % ophthalmic suspension   saccharomyces boulardii (FLORASTOR) 250 MG capsule   sodium chloride  (MURO 128) 2 % ophthalmic solution   TURMERIC EXTRA STRENGTH PO   valACYclovir (VALTREX) 1000 MG tablet   zinc sulfate, 50mg  elemental zinc, 220 (50 Zn) MG capsule   No current facility-administered medications for this encounter.   Burnard CHRISTELLA Odis DEVONNA [facility] Surgical Short Stay/Anesthesiology [facility] Phone [phone] 06/26/2024 9:10 AM

## 2024-06-29 ENCOUNTER — Telehealth: Payer: Self-pay | Admitting: *Deleted

## 2024-06-29 NOTE — Anesthesia Preprocedure Evaluation (Addendum)
 Anesthesia Evaluation  Patient identified by MRN, date of birth, ID band Patient awake    Reviewed: Allergy & Precautions, H&P , NPO status , Patient's Chart, lab work & pertinent test results  Airway Mallampati: II  TM Distance: >3 FB Neck ROM: Full    Dental no notable dental hx.    Pulmonary neg pulmonary ROS   Pulmonary exam normal breath sounds clear to auscultation       Cardiovascular hypertension, Pt. on medications negative cardio ROS Normal cardiovascular exam Rhythm:Regular Rate:Normal     Neuro/Psych  Headaches PSYCHIATRIC DISORDERS Anxiety     negative neurological ROS  negative psych ROS   GI/Hepatic negative GI ROS, Neg liver ROS,,,  Endo/Other  negative endocrine ROSdiabetes, Well Controlled, Type 2, Oral Hypoglycemic Agents  BMI 31  Renal/GU negative Renal ROS   Prostate ca negative genitourinary   Musculoskeletal negative musculoskeletal ROS (+)    Abdominal  (+) + obese  Peds negative pediatric ROS (+)  Hematology negative hematology ROS (+)   Anesthesia Other Findings   Reproductive/Obstetrics negative OB ROS                              Anesthesia Physical Anesthesia Plan  ASA: 3  Anesthesia Plan: General   Post-op Pain Management: Tylenol  PO (pre-op)*   Induction: Intravenous  PONV Risk Score and Plan: 2 and Ondansetron , Treatment may vary due to age or medical condition and Midazolam  Airway Management Planned: Oral ETT  Additional Equipment: None  Intra-op Plan:   Post-operative Plan: Extubation in OR  Informed Consent:      Dental advisory given  Plan Discussed with:   Anesthesia Plan Comments:          Anesthesia Quick Evaluation

## 2024-06-29 NOTE — Telephone Encounter (Signed)
 CALLED PATIENT TO REMIND OF PROCEDURE FOR 06/30/24, SPOKE WITH PATIENT AND HE IS AWARE OF THIS PROCEDURE

## 2024-06-30 ENCOUNTER — Other Ambulatory Visit: Payer: Self-pay | Admitting: Urology

## 2024-06-30 ENCOUNTER — Ambulatory Visit (HOSPITAL_COMMUNITY)
Admission: RE | Admit: 2024-06-30 | Discharge: 2024-06-30 | Disposition: A | Discharge status: Home / Self Care | Source: Ambulatory Visit | Attending: Urology | Admitting: Urology

## 2024-06-30 ENCOUNTER — Ambulatory Visit (HOSPITAL_COMMUNITY)

## 2024-06-30 ENCOUNTER — Encounter (HOSPITAL_COMMUNITY): Payer: Self-pay | Admitting: Urology

## 2024-06-30 ENCOUNTER — Ambulatory Visit (HOSPITAL_COMMUNITY): Payer: Self-pay | Admitting: Medical

## 2024-06-30 ENCOUNTER — Encounter (HOSPITAL_COMMUNITY)
Admission: RE | Disposition: A | Discharge status: Home / Self Care | Payer: Self-pay | Source: Ambulatory Visit | Attending: Urology

## 2024-06-30 ENCOUNTER — Ambulatory Visit (HOSPITAL_COMMUNITY): Payer: Self-pay | Admitting: Anesthesiology

## 2024-06-30 DIAGNOSIS — K219 Gastro-esophageal reflux disease without esophagitis: Secondary | ICD-10-CM | POA: Insufficient documentation

## 2024-06-30 DIAGNOSIS — C61 Malignant neoplasm of prostate: Secondary | ICD-10-CM

## 2024-06-30 DIAGNOSIS — E119 Type 2 diabetes mellitus without complications: Secondary | ICD-10-CM

## 2024-06-30 DIAGNOSIS — I10 Essential (primary) hypertension: Secondary | ICD-10-CM | POA: Diagnosis not present

## 2024-06-30 DIAGNOSIS — F419 Anxiety disorder, unspecified: Secondary | ICD-10-CM

## 2024-06-30 DIAGNOSIS — Z7984 Long term (current) use of oral hypoglycemic drugs: Secondary | ICD-10-CM | POA: Insufficient documentation

## 2024-06-30 DIAGNOSIS — E1100 Type 2 diabetes mellitus with hyperosmolarity without nonketotic hyperglycemic-hyperosmolar coma (NKHHC): Secondary | ICD-10-CM

## 2024-06-30 HISTORY — PX: SPACE OAR INSTILLATION: SHX6769

## 2024-06-30 HISTORY — PX: RADIOACTIVE SEED IMPLANT: SHX5150

## 2024-06-30 LAB — GLUCOSE, CAPILLARY
Glucose-Capillary: 138 mg/dL — ABNORMAL HIGH (ref 70–99)
Glucose-Capillary: 127 mg/dL — ABNORMAL HIGH (ref 70–99)

## 2024-06-30 SURGERY — INSERTION, RADIATION SOURCE, PROSTATE
Anesthesia: General

## 2024-06-30 MED ORDER — LIDOCAINE HCL (PF) 2 % IJ SOLN
INTRAMUSCULAR | Status: AC
  Filled 2024-06-30: qty 5

## 2024-06-30 MED ORDER — ROCURONIUM BROMIDE 10 MG/ML (PF) SYRINGE
PREFILLED_SYRINGE | INTRAVENOUS | Status: DC | PRN
  Administered 2024-06-30: 80 mg via INTRAVENOUS

## 2024-06-30 MED ORDER — SODIUM CHLORIDE (PF) 0.9 % IJ SOLN
INTRAMUSCULAR | Status: AC
  Filled 2024-06-30: qty 10

## 2024-06-30 MED ORDER — STERILE WATER FOR IRRIGATION IR SOLN
Status: DC | PRN
  Administered 2024-06-30: 500 mL

## 2024-06-30 MED ORDER — LACTATED RINGERS IV SOLN: INTRAVENOUS | Status: DC

## 2024-06-30 MED ORDER — ROCURONIUM BROMIDE 10 MG/ML (PF) SYRINGE
PREFILLED_SYRINGE | INTRAVENOUS | Status: AC
  Filled 2024-06-30: qty 10

## 2024-06-30 MED ORDER — ONDANSETRON HCL 4 MG/2ML IJ SOLN
4.0000 mg | Freq: Once | INTRAMUSCULAR | Status: DC | PRN
Start: 2024-06-30 — End: 2024-06-30

## 2024-06-30 MED ORDER — CHLORHEXIDINE GLUCONATE 0.12 % MT SOLN
15.0000 mL | Freq: Once | OROMUCOSAL | Status: AC
  Administered 2024-06-30: 15 mL via OROMUCOSAL

## 2024-06-30 MED ORDER — ACETAMINOPHEN 500 MG PO TABS
1000.0000 mg | ORAL_TABLET | Freq: Once | ORAL | Status: AC
  Administered 2024-06-30: 1000 mg via ORAL
  Filled 2024-06-30: qty 2

## 2024-06-30 MED ORDER — FENTANYL CITRATE (PF) 250 MCG/5ML IJ SOLN
INTRAMUSCULAR | Status: AC
  Filled 2024-06-30: qty 5

## 2024-06-30 MED ORDER — AMISULPRIDE (ANTIEMETIC) 5 MG/2ML IV SOLN: 10.0000 mg | Freq: Once | INTRAVENOUS | Status: DC | PRN

## 2024-06-30 MED ORDER — CIPROFLOXACIN IN D5W 400 MG/200ML IV SOLN
INTRAVENOUS | Status: DC | PRN
  Administered 2024-06-30: 400 mg via INTRAVENOUS

## 2024-06-30 MED ORDER — CIPROFLOXACIN IN D5W 400 MG/200ML IV SOLN
400.0000 mg | INTRAVENOUS | Status: DC
  Filled 2024-06-30: qty 200

## 2024-06-30 MED ORDER — OXYCODONE HCL 5 MG/5ML PO SOLN: 5.0000 mg | Freq: Once | ORAL | Status: DC | PRN

## 2024-06-30 MED ORDER — IOHEXOL 300 MG/ML  SOLN
INTRAMUSCULAR | Status: DC | PRN
  Administered 2024-06-30: 5 mL

## 2024-06-30 MED ORDER — FENTANYL CITRATE PF 50 MCG/ML IJ SOSY: 25.0000 ug | PREFILLED_SYRINGE | INTRAMUSCULAR | Status: DC | PRN

## 2024-06-30 MED ORDER — INSULIN ASPART 100 UNIT/ML IJ SOLN: 0.0000 [IU] | INTRAMUSCULAR | Status: DC | PRN

## 2024-06-30 MED ORDER — SUGAMMADEX SODIUM 200 MG/2ML IV SOLN
INTRAVENOUS | Status: DC | PRN
  Administered 2024-06-30: 200 mg via INTRAVENOUS

## 2024-06-30 MED ORDER — OXYCODONE HCL 5 MG PO TABS: 5.0000 mg | ORAL_TABLET | Freq: Once | ORAL | Status: DC | PRN

## 2024-06-30 MED ORDER — HYDROMORPHONE HCL 1 MG/ML IJ SOLN
INTRAMUSCULAR | Status: AC
Start: 2024-06-30 — End: 2024-06-30
  Filled 2024-06-30: qty 1

## 2024-06-30 MED ORDER — SODIUM CHLORIDE 0.9 % IV SOLN: 12.5000 mg | INTRAVENOUS | Status: DC | PRN

## 2024-06-30 MED ORDER — HYDROMORPHONE HCL 1 MG/ML IJ SOLN
0.2500 mg | INTRAMUSCULAR | Status: DC | PRN
  Administered 2024-06-30: 0.5 mg via INTRAVENOUS

## 2024-06-30 MED ORDER — PHENYLEPHRINE 80 MCG/ML (10ML) SYRINGE FOR IV PUSH (FOR BLOOD PRESSURE SUPPORT)
PREFILLED_SYRINGE | INTRAVENOUS | Status: DC | PRN
Start: 2024-06-30 — End: 2024-06-30
  Administered 2024-06-30 (×4): 80 ug via INTRAVENOUS

## 2024-06-30 MED ORDER — FLEET ENEMA RE ENEM
1.0000 | ENEMA | Freq: Once | RECTAL | Status: DC
  Filled 2024-06-30: qty 1

## 2024-06-30 MED ORDER — OXYCODONE-ACETAMINOPHEN 5-325 MG PO TABS: 1.0000 | ORAL_TABLET | ORAL | 0 refills | Status: DC | PRN

## 2024-06-30 MED ORDER — SODIUM CHLORIDE 0.9 % IR SOLN
Status: DC | PRN
  Administered 2024-06-30: 1000 mL via INTRAVESICAL

## 2024-06-30 MED ORDER — FENTANYL CITRATE (PF) 250 MCG/5ML IJ SOLN
INTRAMUSCULAR | Status: DC | PRN
  Administered 2024-06-30: 100 ug via INTRAVENOUS

## 2024-06-30 MED ORDER — ONDANSETRON HCL 4 MG/2ML IJ SOLN
INTRAMUSCULAR | Status: DC | PRN
  Administered 2024-06-30: 4 mg via INTRAVENOUS

## 2024-06-30 MED ORDER — PROPOFOL 10 MG/ML IV BOLUS
INTRAVENOUS | Status: DC | PRN
  Administered 2024-06-30: 50 mg via INTRAVENOUS
  Administered 2024-06-30: 150 mg via INTRAVENOUS

## 2024-06-30 MED ORDER — LIDOCAINE HCL (PF) 2 % IJ SOLN
INTRAMUSCULAR | Status: DC | PRN
  Administered 2024-06-30: 60 mg via INTRADERMAL

## 2024-06-30 MED ORDER — ORAL CARE MOUTH RINSE: 15.0000 mL | Freq: Once | OROMUCOSAL | Status: AC

## 2024-06-30 MED ORDER — PROPOFOL 10 MG/ML IV BOLUS
INTRAVENOUS | Status: AC
Start: 2024-06-30 — End: 2024-06-30
  Filled 2024-06-30: qty 20

## 2024-06-30 SURGICAL SUPPLY — 34 items
BAG URINE DRAIN 2000ML AR STRL (UROLOGICAL SUPPLIES) IMPLANT
BARD BRACHYSOURCE I-125 Implant Seed IMPLANT
CATH FOLEY 2WAY SLVR 5CC 16FR (CATHETERS) IMPLANT
CATH ROBINSON RED A/P 20FR (CATHETERS) ×1 IMPLANT
CNTNR URN SCR LID CUP LEK RST (MISCELLANEOUS) ×1 IMPLANT
COVER BACK TABLE 60X90IN (DRAPES) ×1 IMPLANT
COVER MAYO STAND STRL (DRAPES) ×1 IMPLANT
DRAPE SURG IRRIG POUCH 19X23 (DRAPES) ×1 IMPLANT
DRSG TEGADERM 4X4.75 (GAUZE/BANDAGES/DRESSINGS) IMPLANT
DRSG TEGADERM 8X12 (GAUZE/BANDAGES/DRESSINGS) ×2 IMPLANT
GLOVE BIOGEL M 7.0 STRL (GLOVE) ×1 IMPLANT
GLOVE SURG LX STRL 7.5 STRW (GLOVE) ×2 IMPLANT
GLOVE SURG ORTHO 8.5 STRL (GLOVE) IMPLANT
GOWN STRL REUS W/ TWL XL LVL3 (GOWN DISPOSABLE) ×1 IMPLANT
GRID BRACH TEMP 18GA 2.8X3X.75 (MISCELLANEOUS) ×1 IMPLANT
HOLDER FOLEY CATH W/STRAP (MISCELLANEOUS) IMPLANT
IMPL SPACEOAR SYSTEM 10ML (Spacer) ×1 IMPLANT
KIT TURNOVER KIT A (KITS) ×1 IMPLANT
MARKER SKIN DUAL TIP RULER LAB (MISCELLANEOUS) ×1 IMPLANT
NDL BRACHY 18G 5PK (NEEDLE) ×4 IMPLANT
NDL BRACHY 18G SINGLE (NEEDLE) IMPLANT
NDL PK MORGANSTERN STABILIZ (NEEDLE) ×1 IMPLANT
NEEDLE BRACHY 18G 5PK (NEEDLE) ×4 IMPLANT
NEEDLE BRACHY 18G SINGLE (NEEDLE) IMPLANT
NEEDLE PK MORGANSTERN STABILIZ (NEEDLE) ×1 IMPLANT
PACK CYSTO (CUSTOM PROCEDURE TRAY) ×1 IMPLANT
SHEATH ULTRASOUND LF (SHEATH) ×1 IMPLANT
SHEATH ULTRASOUND LTX NONSTRL (SHEATH) ×1 IMPLANT
SURGILUBE 2OZ TUBE FLIPTOP (MISCELLANEOUS) ×2 IMPLANT
SYR 10ML LL (SYRINGE) ×1 IMPLANT
SYR CONTROL 10ML LL (SYRINGE) ×1 IMPLANT
TOWEL OR 17X26 10 PK STRL BLUE (TOWEL DISPOSABLE) ×1 IMPLANT
TRAY FOLEY MTR SLVR 16FR STAT (SET/KITS/TRAYS/PACK) ×1 IMPLANT
UNDERPAD 30X36 HEAVY ABSORB (UNDERPADS AND DIAPERS) ×2 IMPLANT

## 2024-06-30 NOTE — H&P (Signed)
 Office Visit Report     06/20/2024   --------------------------------------------------------------------------------   Arthur Spencer  MRN: [id number]  DOB: [birth year], 78 year old Male  SSN:    PRIMARY CARE:  Arnulfo Sharps  PRIMARY CARE FAX:  [phone]  REFERRING:  Arthur FABIENE Siad, MD  PROVIDER:  Donnice Spencer, M.D.  TREATING:  Arthur Eagles, NP  LOCATION:  [facility] [phone]     --------------------------------------------------------------------------------   CC/HPI: 06/20/2024: Patient with below noted history here today for preoperative appointment prior to undergoing brachytherapy seed implant and SpaceOAR insertion with Dr. Siad on 7/18. Overall doing well today. 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 He denies any other interval changes in past medical history, prescription medications taken on a daily basis, no interval surgical or procedural intervention. He continues to void at his baseline with grossly stable nonbothersome symptomology including absence of any dysuria or gross hematuria. Patient denies any recent fever/chills, nausea/vomiting, chest pain or shortness of breath.    Arthur Spencer is seen to discuss his new diagnosis of prostate cancer and definitive treatment options.   1. Localized unfavorable intermediate risk prostate cancer:  Patient had rising PSA up to 5.66.Prostate MRI 01/16/2024 with PI-RADS category 4 lesion in the right anterior transition zone and 2 separate PI-RADS category 3 lesions of the peripheral zone. Prostate volume 35 cc.  Patient underwent MRI fusion prostate biopsy on 03/07/2024 for an elevated PSA of 5.66 ng/mL. Biopsy revealed GS 3+3 = 6 at the ROI #1, GS 3+3 = 6 at the ROI 1 on the right and GS 3+4 = 7 at the ROI 2 on the right, GS 4+3 = 7 in 2 cores, GS 3+4 = 7 in 2 cores and GS 3+3 = 6 in 2 cores at the standard biopsy with  adenocarcinoma of the prostate with 15/21 total cores positive (5-70%), TRUS volume of 32 cm3. Denies new or worsening bone or back pain. Good appetite and stable weight.   Family history: Denies  Imaging studies:  Patient had rising PSA up to 5.66.Prostate MRI 01/16/2024 with PI-RADS category 4 lesion in the right anterior transition zone and 2 separate PI-RADS category 3 lesions of the peripheral zone. Prostate volume 35 cc.  PSMA PET scan indicated no evidence of metastatic disease   PMH: Arthritis, diabetes type 2, GERD, hypertension, hyperlipidemia  PSH: Denies prior abdominal surgeries   TNM stage: cT1c  PSA: 5.66  Gleason score: 4+3=7  Biopsy: 03/07/2024  Left: 3+3 = 6 at the left ROI, GS 4+3 = 7 at the left apex, GS 3+3= 6 at the left mid  Right: GS 3+3 = 6 at the ROI 2 on the right and GS service for = 7 at the ROI 2 on the right; GS 3+4 = 7 at the right mid, right lateral mid, right apex lateral GS 3 +3= 6 at the right apex lateral  Prostate volume: 32cc  PSAD: 0.17   Nomogram  CSS (15 year): 95%  PFS (5 year, 10 year): 65%, 49%  EPE: 55%  LNI: 10%  SVI: 8%   2. 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 He denies significant lower urinary tract symptoms. IPSS score is 1, QOL at 0. He has 1 time nocturia.     ALLERGIES: No Allergies    MEDICATIONS: Bayer Advanced Aspirin Reg St 325 MG Tablet  Cinnamon  Plus Chromium  Co Q-10  Fish Oil  Fluticasone Propionate 50 MCG/ACT Suspension  Glucosamine & Chondroitin  hydroCHLOROthiazide 25 MG Tablet tablet  Lipitor 40 MG Tablet tablet  Lipoic Acid  Lisinopril  5 MG Tablet  Magnesium   metFORMIN HCl 500 MG Tablet tablet  Niacin  Oil Of Oregano  Probiotic  RA Saw Palmetto 160 MG Capsule  Vitamin B12  Vitamin C  Vitamin C With Iron  Zetia     GU PSH: Prostate Needle Biopsy - 03/07/2024     NON-GU PSH: Back Surgery (Unspecified) Surgical Pathology, Gross And Microscopic Examination For Prostate Needle -  03/07/2024 Visit Complexity (formerly GPC1X) - 03/23/2024, 03/14/2024, 11/25/2023     GU PMH: BPH w/o LUTS - 03/23/2024, - 03/14/2024, - 03/07/2024, - 11/25/2023 (Stable), - 05/26/2023, - 11/19/2022, - 2023 Prostate Cancer - 03/23/2024, - 03/14/2024 Elevated PSA - 03/07/2024, - 11/25/2023, - 05/26/2023, - 11/19/2022, - 2023 Pelvic/perineal pain - 2019    NON-GU PMH: Arthritis Diabetes Type 2 GERD Hypercholesterolemia Hypertension    FAMILY HISTORY: No Family History    SOCIAL HISTORY: Marital Status: Married Ethnicity: Not Hispanic Or Latino; Race: Black or African American Current Smoking Status: Patient has never smoked.   Tobacco Use Assessment Completed: Used Tobacco in last 30 days? Has never drank.  Drinks 1 caffeinated drink per day.    REVIEW OF SYSTEMS:    GU Review Male:   Patient reports frequent urination and get up at night to urinate. Patient denies hard to postpone urination, burning/ pain with urination, leakage of urine, stream starts and stops, trouble starting your stream, have to strain to urinate , erection problems, and penile pain.  Gastrointestinal (Upper):   Patient denies nausea, vomiting, and indigestion/ heartburn.  Gastrointestinal (Lower):   Patient denies diarrhea and constipation.  Constitutional:   Patient denies fever, night sweats, weight loss, and fatigue.  Skin:   Patient denies skin rash/ lesion and itching.  Eyes:   Patient denies blurred vision and double vision.  Ears/ Nose/ Throat:   Patient denies sore throat and sinus problems.  Hematologic/Lymphatic:   Patient denies swollen glands and easy bruising.  Cardiovascular:   Patient denies leg swelling and chest pains.  Respiratory:   Patient denies cough and shortness of breath.  Endocrine:   Patient denies excessive thirst.  Musculoskeletal:   Patient denies back pain and joint pain.  Neurological:   Patient denies headaches and dizziness.  Psychologic:   Patient denies depression and anxiety.    Notes: Reviewed previous review of systems 11/25/2023. No changes.   VITAL SIGNS:      06/20/2024 12:47 PM  Weight 225 lb / 102.06 kg  Height 73 in / 185.42 cm  Temperature 97.3 F / 36.2 C  BMI 29.7 kg/m   MULTI-SYSTEM PHYSICAL EXAMINATION:    Constitutional: Well-nourished. No physical deformities. Normally developed. Good grooming.  Neck: Neck symmetrical, not swollen. Normal tracheal position.  Respiratory: No labored breathing, no use of accessory muscles.   Cardiovascular: Normal temperature, normal extremity pulses, no swelling, no varicosities.  Skin: No paleness, no jaundice, no cyanosis. No lesion, no ulcer, no rash.  Neurologic / Psychiatric: Oriented to time, oriented to place, oriented to person. No depression, no anxiety, no agitation.  Gastrointestinal: No mass, no tenderness, no rigidity, non obese abdomen.  Musculoskeletal: Normal gait and station of head and neck.     Complexity of Data:  Source Of History:  Patient, Medical Record Summary  Lab Test Review:   PSA  Records Review:   Pathology Reports, Previous Doctor Records, Previous Hospital Records, Previous Patient Records  Urine Test Review:   Urinalysis  X-Ray Review: PET- PSMA Scan: Reviewed Report.  MRI Prostate GSORAD: Reviewed Report.     11/18/23 05/19/23 11/12/22  PSA  Total PSA 5.66 ng/mL 3.92 ng/mL 4.16 ng/mL  Free PSA   0.85 ng/mL  % Free PSA   20 % PSA    06/20/24  Urinalysis  Urine Appearance Clear   Urine Color Yellow   Urine Glucose Neg mg/dL  Urine Bilirubin Neg mg/dL  Urine Ketones Neg mg/dL  Urine Specific Gravity 1.020   Urine Blood Neg ery/uL  Urine pH 6.5   Urine Protein Neg mg/dL  Urine Urobilinogen 0.2 mg/dL  Urine Nitrites Neg   Urine Leukocyte Esterase Neg leu/uL   PROCEDURES:          Visit Complexity - G2211          Urinalysis Dipstick Dipstick Cont'd  Color: Yellow Bilirubin: Neg mg/dL  Appearance: Clear Ketones: Neg mg/dL  Specific Gravity: 8.979 Blood:  Neg ery/uL  pH: 6.5 Protein: Neg mg/dL  Glucose: Neg mg/dL Urobilinogen: 0.2 mg/dL    Nitrites: Neg    Leukocyte Esterase: Neg leu/uL    ASSESSMENT:      ICD-10 Details  1 GU:   Prostate Cancer - C61 Chronic, Threat to Bodily Function  2 NON-GU:   Encounter for other preprocedural examination - Z01.818 Undiagnosed New Problem   PLAN:           Orders Labs Urine Culture          Schedule Return Visit/Planned Activity: Keep Scheduled Appointment - Follow up MD, Schedule Surgery          Document Letter(s):  Created for Patient: Clinical Summary         Notes:   All questions answered to the best of my ability regarding the upcoming procedure and expected postoperative course with understanding expressed by the patient. Precautionary urine culture sent today to serve as a baseline. He will proceed with previously scheduled brachytherapy and SpaceOAR insertion on 7/18 with Dr. Selma.        Next Appointment:      Next Appointment: 06/30/2024 01:00 PM    Appointment Type: Surgery     Location: [facility] [phone]    Provider: Donnice Spencer, M.D.    Reason for Visit: [facility]/OP BRACHYTHERAPY AND SPACE OAR    Urology Preoperative H&P   Chief Complaint: Prostate cancer  History of Present Illness: Arthur Spencer is a 78 y.o. male with prostate cancer here for brachytherapy with SpaceOAR. Denies fevers, chills, dysuria.    Past Medical History:  Diagnosis Date   Diabetes mellitus without complication (HCC)    Epididymitis 02/13/2018   High cholesterol    Hypertension    Migraines    PTSD (post-traumatic stress disorder)     Past Surgical History:  Procedure Laterality Date   CATARACT EXTRACTION Bilateral    INGUINAL HERNIA REPAIR     As infant   LUMBAR SPINE SURGERY  2009   discectomy   LUMBAR SPINE SURGERY  2015   enlarged nerve canal    PROSTATE BIOPSY     TONSILLECTOMY      Allergies: No Known Allergies  History reviewed. No pertinent family  history.  Social History:  reports that he has never smoked. He has never used smokeless tobacco. He reports that he does not currently use alcohol. He  reports that he does not use drugs.  ROS: A complete review of systems was performed.  All systems are negative except for pertinent findings as noted.  Physical Exam:  Vital signs in last 24 hours: Temp:  [98.8 F (37.1 C)] 98.8 F (37.1 C) (07/18 1118) Pulse Rate:  [62] 62 (07/18 1118) Resp:  [16] 16 (07/18 1118) BP: (157)/(54) 157/54 (07/18 1118) SpO2:  [62 %] 62 % (07/18 1118) Weight:  [105 kg-105.2 kg] 105 kg (07/18 1129) Constitutional:  Alert and oriented, No acute distress Cardiovascular: Regular rate and rhythm Respiratory: Normal respiratory effort, Lungs clear bilaterally GI: Abdomen is soft, nontender, nondistended, no abdominal masses GU: No CVA tenderness Lymphatic: No lymphadenopathy Neurologic: Grossly intact, no focal deficits Psychiatric: Normal mood and affect  Laboratory Data:  No results for input(s): WBC, HGB, HCT, PLT in the last 72 hours.  No results for input(s): NA, K, CL, GLUCOSE, BUN, CALCIUM , CREATININE in the last 72 hours.  Invalid input(s): CO3   Results for orders placed or performed during the hospital encounter of 06/30/24 (from the past 24 hours)  Glucose, capillary     Status: Abnormal   Collection Time: 06/30/24 11:23 AM  Result Value Ref Range   Glucose-Capillary 138 (H) 70 - 99 mg/dL   Comment 1 Notify RN    No results found for this or any previous visit (from the past 240 hours).  Renal Function: No results for input(s): CREATININE in the last 168 hours. Estimated Creatinine Clearance: 96.8 mL/min (by C-G formula based on SCr of 0.67 mg/dL).  Radiologic Imaging: No results found.  I independently reviewed the above imaging studies.  Assessment and Plan Arthur Spencer is a 78 y.o. male with prostate cancer here for brachytherapy with SpaceOAR.   Arthur  R. Danniela Mcbrearty MD 06/30/2024, 12:10 PM  [facility] Pager: [phone]): [phone]

## 2024-06-30 NOTE — Progress Notes (Signed)
 Radiation Oncology         [phone] ________________________________  Name: Arthur Spencer MRN: [id number]  Date: 06/30/2024  DOB: [birth year]       Prostate Seed Implant  CC:Bakare, Mobolaji B, MD  No ref. provider found  DIAGNOSIS:  78 y.o. gentleman with Stage T1c adenocarcinoma of the prostate with Gleason score of 4+3, and PSA of 5.66.   Oncology History  Malignant neoplasm of prostate (HCC)  03/07/2024 Cancer Staging   Staging form: Prostate, AJCC 8th Edition - Clinical stage from 03/07/2024: Stage IIC (cT1c, cN0, cM0, PSA: 5.7, Grade Group: 3) - Signed by Sherwood Rise, PA-C on 04/11/2024 Histopathologic type: Adenocarcinoma, NOS Stage prefix: Initial diagnosis Prostate specific antigen (PSA) range: Less than 10 Gleason primary pattern: 4 Gleason secondary pattern: 3 Gleason score: 7 Histologic grading system: 5 grade system Number of biopsy cores examined: 21 Number of biopsy cores positive: 12 Location of positive needle core biopsies: Both sides   04/11/2024 Initial Diagnosis   Malignant neoplasm of prostate (HCC)       ICD-10-CM   1. Prostate cancer [facility])  C61 Discharge patient    2. Type 2 diabetes mellitus with hyperosmolarity without coma, without long-term current use of insulin  (HCC)  E11.00 CBG per Guidelines for Diabetes Management for Patients Undergoing Surgery ([facility], [facility], and [facility] only)    CBG per Guidelines for Diabetes Management for Patients Undergoing Surgery ([facility], [facility], and [facility] only)      PROCEDURE: Insertion of radioactive I-125 seeds into the prostate gland.  RADIATION DOSE: 145 Gy, definitive therapy.  TECHNIQUE: Arthur Spencer was brought to the operating room with the urologist. He was placed in the dorsolithotomy position. He was catheterized and a rectal tube was inserted. The perineum was shaved, prepped and draped. The ultrasound probe was then introduced by me into the rectum to see the prostate gland.  TREATMENT DEVICE: I attached the needle grid to  the ultrasound probe stand and anchor needles were placed.  3D PLANNING: The prostate was imaged in 3D using a sagittal sweep of the prostate probe. These images were transferred to the planning computer. There, the prostate, urethra and rectum were defined on each axial reconstructed image. Then, the software created an optimized 3D plan and a few seed positions were adjusted. The quality of the plan was reviewed using [facility] information for the target and the following two organs at risk:  Urethra and Rectum.  Then the accepted plan was printed and handed off to the radiation therapist.  Under my supervision, the custom loading of the seeds and spacers was carried out using the quick loader.  These pre-loaded needles were then placed into the needle holder.SABRA  PROSTATE VOLUME STUDY:  Using transrectal ultrasound the volume of the prostate was verified to be 39 cc.  SPECIAL TREATMENT PROCEDURE/SUPERVISION AND HANDLING: The pre-loaded needles were then delivered by the urologist under sagittal guidance. A total of 18 needles were used to deposit 71 seeds in the prostate gland. The individual seed activity was 0.393 mCi.  SpaceOAR:  Yes  COMPLEX SIMULATION: At the end of the procedure, an anterior radiograph of the pelvis was obtained to document seed positioning and count. Cystoscopy was performed by the urologist to check the urethra and bladder.  MICRODOSIMETRY: At the end of the procedure, the patient was emitting 0.073 mR/hr at 1 meter. Accordingly, he was considered safe for hospital discharge.  PLAN: The patient will return to the radiation oncology clinic for post implant CT dosimetry  in three weeks.   ________________________________  Arthur Spencer, M.D.

## 2024-06-30 NOTE — Anesthesia Postprocedure Evaluation (Signed)
 Anesthesia Post Note  Patient: Arthur Spencer  Procedure(s) Performed: INSERTION, RADIATION SOURCE, PROSTATE INJECTION, HYDROGEL SPACER     Patient location during evaluation: PACU Anesthesia Type: General Level of consciousness: awake and alert, oriented and patient cooperative Pain management: pain level controlled Vital Signs Assessment: post-procedure vital signs reviewed and stable Respiratory status: spontaneous breathing, nonlabored ventilation and respiratory function stable Cardiovascular status: blood pressure returned to baseline and stable Postop Assessment: no apparent nausea or vomiting Anesthetic complications: no   No notable events documented.  Last Vitals:  Vitals:   06/30/24 1500 06/30/24 1515  BP: (!) 168/65 (!) 178/63  Pulse: (!) 48 (!) 44  Resp: 17 10  Temp:    SpO2: 96% 95%    Last Pain:  Vitals:   06/30/24 1500  TempSrc:   PainSc: 2                  Almarie CHRISTELLA Marchi

## 2024-06-30 NOTE — Transfer of Care (Signed)
 Immediate Anesthesia Transfer of Care Note  Patient: Arthur Spencer  Procedure(s) Performed: INSERTION, RADIATION SOURCE, PROSTATE INJECTION, HYDROGEL SPACER  Patient Location: PACU  Anesthesia Type:General  Level of Consciousness: awake, alert , and oriented  Airway & Oxygen Therapy: Patient Spontanous Breathing  Post-op Assessment: Report given to RN and Post -op Vital signs reviewed and stable  Post vital signs: Reviewed and stable  Last Vitals:  Vitals Value Taken Time  BP 184/71 06/30/24 14:37  Temp    Pulse 49 06/30/24 14:44  Resp 10 06/30/24 14:44  SpO2 98 % 06/30/24 14:44  Vitals shown include unfiled device data.  Last Pain:  Vitals:   06/30/24 1129  TempSrc:   PainSc: 6          Complications: No notable events documented.

## 2024-06-30 NOTE — Op Note (Signed)
 PATIENT:  Arthur Spencer  PRE-OPERATIVE DIAGNOSIS:  Adenocarcinoma of the prostate  POST-OPERATIVE DIAGNOSIS:  Same  PROCEDURE:  1. I-125 radioactive seed implantation 2. Cystoscopy  3. Placement of SpaceOAR  SURGEON:  Donnice Siad, MD  Radiation oncologist: Donnice Barge, MD  ANESTHESIA:  General  EBL:  Minimal  DRAINS: None  INDICATION: Arthur Spencer  Description of procedure: After informed consent the patient was brought to the major OR, placed on the table and administered general anesthesia. He was then moved to the modified lithotomy position with his perineum perpendicular to the floor. His perineum and genitalia were then sterilely prepped. An official timeout was then performed. A 16 French Foley catheter was then placed in the bladder and filled with dilute contrast, a rectal tube was placed in the rectum and the transrectal ultrasound probe was placed in the rectum and affixed to the stand. He was then sterilely draped.  Real time ultrasonography was used along with the seed planning software. This was used to develop the seed plan including the number of needles as well as number of seeds required for complete and adequate coverage. Real-time ultrasonography was then used along with the previously developed plan and the Nucletron device to implant a total of 71 seeds using 18 needles. This proceeded without difficulty or complication.   I then proceeded with placement of SpaceOAR by introducing a needle with the bevel angled inferiorly approximately 2 cm superior to the anus. This was angled downward and under direct ultrasound was placed within the space between the prostatic capsule and rectum. This was confirmed with a small amount of sterile saline injected and this was performed under direct ultrasound. I then attached the SpaceOAR to the needle and injected this in the space between the prostate and rectum with good placement noted.  A Foley catheter was then removed as well  as the transrectal ultrasound probe and rectal probe. Flexible cystoscopy was then performed using the 16 French flexible scope which revealed a normal urethra throughout its length down to the sphincter which appeared intact. The prostatic urethra revealed bilobar hypertrophy but no evidence of obstruction, seeds, spacers or lesions. The bladder was then entered and fully and systematically inspected. The ureteral orifices were noted to be of normal configuration and position. The mucosa revealed no evidence of tumors. There were also no stones identified within the bladder. I noted no seeds or spacers on the floor of the bladder and retroflexion of the scope revealed no seeds protruding from the base of the prostate.  The cystoscope was then removed and the patient was awakened and taken to recovery room in stable and satisfactory condition. He tolerated procedure well and there were no intraoperative complications.  Arthur R. Menno Vanbergen MD [facility]  Pager: [phone]

## 2024-06-30 NOTE — Anesthesia Procedure Notes (Signed)
 Procedure Name: Intubation Date/Time: 06/30/2024 1:29 PM  Performed by: Gladis Honey, CRNAPre-anesthesia Checklist: Patient identified, Emergency Drugs available, Suction available and Patient being monitored Patient Re-evaluated:Patient Re-evaluated prior to induction Oxygen Delivery Method: Circle System Utilized Preoxygenation: Pre-oxygenation with 100% oxygen Induction Type: IV induction Ventilation: Mask ventilation without difficulty Laryngoscope Size: Miller and 2 Grade View: Grade II Tube type: Oral Number of attempts: 1 Airway Equipment and Method: Stylet and Oral airway Placement Confirmation: ETT inserted through vocal cords under direct vision, positive ETCO2 and breath sounds checked- equal and bilateral Secured at: 24 cm Tube secured with: Tape Dental Injury: Teeth and Oropharynx as per pre-operative assessment

## 2024-06-30 NOTE — Progress Notes (Signed)
 Dr. Cleotilde to bedside to evaluate patient for C/O upper abd discomfort with radiation to the chest. No new orders, per Dr. Cleotilde patient now denies pain.

## 2024-06-30 NOTE — Discharge Instructions (Signed)
   Activity:  You are encouraged to ambulate frequently (about every hour during waking hours) to help prevent blood clots from forming in your legs or lungs.  However, you should not engage in any heavy lifting (> 10-15 lbs), strenuous activity, or straining.   Diet: You should advance your diet as instructed by your physician.  It will be normal to have some bloating, nausea, and abdominal discomfort intermittently.   Prescriptions:  You will be provided a prescription for pain medication to take as needed.  If your pain is not severe enough to require the prescription pain medication, you may take extra strength Tylenol instead which will have less side effects.  You should also take a prescribed stool softener to avoid straining with bowel movements as the prescription pain medication may constipate you.   Incisions: You may remove your dressing bandages 48 hours after surgery if not removed in the hospital.  You will either have some small staples or special tissue glue at each of the incision sites. Once the bandages are removed (if present), the incisions may stay open to air.  You may start showering (but not soaking or bathing in water) the 2nd day after surgery and the incisions simply need to be patted dry after the shower.  No additional care is needed.   What to call us about: You should call the office [phone]) if you develop fever > 101 or develop persistent vomiting. Activity:  You are encouraged to ambulate frequently (about every hour during waking hours) to help prevent blood clots from forming in your legs or lungs.  However, you should not engage in any heavy lifting (> 10-15 lbs), strenuous activity, or straining.

## 2024-07-03 ENCOUNTER — Encounter (HOSPITAL_COMMUNITY): Payer: Self-pay | Admitting: Urology

## 2024-07-06 NOTE — Progress Notes (Signed)
 Patient was a RadOnc Consult on 04/11/24 for his Stage T1c adenocarcinoma of the prostate with Gleason score of 4+3, and PSA of 5.66.  Patient proceed with treatment recommendations of brachytherapy and had his treatment on 06/30/24.   Patient is scheduled for a post CT Sim/MR on 07/19/24 and post op urology on 07/14/24.   RN left message for call back to review post treatment education.

## 2024-07-13 NOTE — Progress Notes (Signed)
 Post-seed nursing interview for a diagnosis of   Patient identity verified x2.   Patient states issues as follows...  -Pain:  Yes reports mild discomfort to rectum, rating 2/10. Patient applying preparation H  -Fatigue: Yes -Abdomen: No -Groin: No -Urinary: yes, dysuria  -Bowels: Constipation, patient taking Miralax  and fiber supplements.  -Appetite: good  Patient denies all other related issues at this time.  Meaningful use complete.  I-PSS (AUA) score- 8 - Mild SHIM (ED) score- 25 Urinary Management medication(s) NA Urology appointment date- 07/14/24 with Dr. Elise nurse practitioner/ patient has follow up in October 2025.   Vitals- BP (!) 152/55   Pulse 60   Temp 98.5 F (36.9 C)   Resp 20   Ht 6' 1 (1.854 m)   Wt 238 lb (108 kg)   SpO2 97%   BMI 31.40 kg/m

## 2024-07-17 ENCOUNTER — Encounter (HOSPITAL_COMMUNITY): Payer: Self-pay | Admitting: Urology

## 2024-07-17 NOTE — Addendum Note (Signed)
 Addendum  created 07/17/24 0802 by Merla Almarie HERO, DO   Intraprocedure Event edited, Intraprocedure Meds edited, Intraprocedure Staff edited

## 2024-07-18 ENCOUNTER — Telehealth: Payer: Self-pay | Admitting: *Deleted

## 2024-07-18 NOTE — Telephone Encounter (Signed)
 CALLED PATIENT TO REMIND OF POST SEED APPTS. AND MRI FOR 07-19-24, SPOKE WITH PATIENT AND HE IS AWARE OF THESE APPTS, AND THE INSTRUCTIONS

## 2024-07-19 ENCOUNTER — Ambulatory Visit
Admission: RE | Admit: 2024-07-19 | Discharge: 2024-07-19 | Disposition: A | Discharge status: Home / Self Care | Payer: Self-pay | Source: Ambulatory Visit | Attending: Urology | Admitting: Urology

## 2024-07-19 ENCOUNTER — Encounter: Payer: Self-pay | Admitting: Urology

## 2024-07-19 ENCOUNTER — Ambulatory Visit
Admission: RE | Admit: 2024-07-19 | Discharge: 2024-07-19 | Disposition: A | Discharge status: Home / Self Care | Payer: Self-pay | Source: Ambulatory Visit | Attending: Radiation Oncology | Admitting: Radiation Oncology

## 2024-07-19 ENCOUNTER — Ambulatory Visit (HOSPITAL_COMMUNITY)
Admission: RE | Admit: 2024-07-19 | Discharge: 2024-07-19 | Disposition: A | Discharge status: Home / Self Care | Source: Ambulatory Visit | Attending: Urology | Admitting: Urology

## 2024-07-19 VITALS — BP 152/55 | HR 60 | Temp 98.5°F | Resp 20 | Ht 73.0 in | Wt 238.0 lb

## 2024-07-19 DIAGNOSIS — C61 Malignant neoplasm of prostate: Secondary | ICD-10-CM | POA: Insufficient documentation

## 2024-07-19 DIAGNOSIS — Z923 Personal history of irradiation: Secondary | ICD-10-CM | POA: Insufficient documentation

## 2024-07-19 DIAGNOSIS — Z79624 Long term (current) use of inhibitors of nucleotide synthesis: Secondary | ICD-10-CM | POA: Insufficient documentation

## 2024-07-19 DIAGNOSIS — Z7984 Long term (current) use of oral hypoglycemic drugs: Secondary | ICD-10-CM | POA: Insufficient documentation

## 2024-07-19 DIAGNOSIS — Z7982 Long term (current) use of aspirin: Secondary | ICD-10-CM | POA: Insufficient documentation

## 2024-07-19 NOTE — Progress Notes (Signed)
 Radiation Oncology         [phone] ________________________________  Name: Arthur Spencer MRN: [id number]  Date: 07/19/2024  DOB: 17-Sep-1946  Post-Seed Follow-Up Visit Note  CC: Roanna Ezekiel NOVAK, MD  Roanna Ezekiel NOVAK, MD  Diagnosis:   78 y.o. gentleman with Stage T1c adenocarcinoma of the prostate with Gleason score of 4+3, and PSA of 5.66.     ICD-10-CM   1. Malignant neoplasm of prostate (HCC)  C61       Interval Since Last Radiation:  2.5 weeks 06/30/24:  Insertion of radioactive I-125 seeds into the prostate gland; 145 Gy, definitive therapy with placement of SpaceOAR gel.  Narrative:  The patient returns today for routine follow-up.  He is complaining of increased urinary frequency and urinary hesitation symptoms. He filled out a questionnaire regarding urinary function today providing and overall IPSS score of 8 characterizing his symptoms as mild with nocturia x3 and some mild dysuria at start of stream.  He specifically denies gross hematuria, straining to void, incomplete bladder emptying, excessive daytime frequency or incontinence.  His pre-implant score was 8. He denies any abdominal pain or bothersome bowel symptoms although he has noticed some constipation and mild rectal fullness/discomfort but these appear to be gradually improving.  He reports a healthy appetite and is maintaining his weight.  He remains active with good energy and overall, he is quite pleased with his progress to date.  ALLERGIES:  has no known allergies.  Meds: Current Outpatient Medications  Medication Sig Dispense Refill   Accu-Chek Softclix Lancets lancets SMARTSIG:1 Topical Daily     acetaminophen  (TYLENOL ) 500 MG tablet Take 500-1,000 mg by mouth every 6 (six) hours as needed (pain.).     albuterol  (VENTOLIN  HFA) 108 (90 Base) MCG/ACT inhaler Inhale 1-2 puffs into the lungs every 6 (six) hours as needed for wheezing or shortness of breath. 6.7 each 0   APPLE CIDER VINEGAR PO Take 200 mg by  mouth in the morning.     Ascorbic Acid (VITAMIN C PO) Take 1 tablet by mouth daily.     aspirin EC 325 MG tablet Take 325 mg by mouth daily.     atorvastatin  (LIPITOR) 40 MG tablet Take 40 mg by mouth in the morning.     Blood Glucose Monitoring Suppl (ACCU-CHEK GUIDE) w/Device KIT      carboxymethylcellulose (REFRESH PLUS) 0.5 % SOLN Place 1 drop into the left eye 3 (three) times daily as needed (dry/irritated eyes.).     Cholecalciferol (VITAMIN D3) 50 MCG (2000 UT) TABS Take 2,000 Units by mouth in the morning.     Cinnamon 500 MG capsule Take 1 capsule by mouth in the morning and at bedtime. Cinnamon Plus Chromium     COENZYME Q10 PO Take 100 mg by mouth in the morning. Qunol     docusate sodium  (COLACE) 100 MG capsule Take 100 mg by mouth 2 (two) times daily.     ezetimibe (ZETIA) 10 MG tablet Take 10 mg by mouth every evening.     fluticasone (FLONASE) 50 MCG/ACT nasal spray Place 1 spray into both nostrils daily as needed (nasal congestion/allergies.).     Glucosamine-Chondroitin (COSAMIN DS PO) Take 1 tablet by mouth in the morning and at bedtime.     hydrochlorothiazide (HYDRODIURIL) 25 MG tablet Take 25 mg by mouth in the morning.     ipratropium (ATROVENT) 0.06 % nasal spray Place 1 spray into both nostrils 2 (two) times daily as needed (congestion/allergies.).  Iron-Vitamin C (VITRON-C) 65-125 MG TABS Take 1 tablet by mouth in the morning.     lidocaine  (LIDODERM ) 5 % Place 1 patch onto the skin daily. Remove & Discard patch within 12 hours or as directed by MD (Patient taking differently: Place 1 patch onto the skin daily as needed (pain.). Remove & Discard patch within 12 hours or as directed by MD) 30 patch 0   lisinopril  (ZESTRIL ) 5 MG tablet Take 5 mg by mouth in the morning.     Magnesium  Oxide -Mg Supplement 250 MG TABS Take 250 mg by mouth in the morning.     metFORMIN (GLUCOPHAGE) 500 MG tablet Take 500 mg by mouth 2 (two) times daily with a meal.      niacin (,VITAMIN  B3,) 500 MG tablet Take 500 mg by mouth in the morning.     OIL OF OREGANO PO Take 1,500 mg by mouth in the morning.     Omega-3 Fatty Acids (FISH OIL PO) Take 1,200 mg by mouth in the morning.     oxyCODONE -acetaminophen  (PERCOCET) 5-325 MG tablet Take 1 tablet by mouth every 4 (four) hours as needed for up to 12 doses for severe pain (pain score 7-10). 12 tablet 0   polyethylene glycol (MIRALAX  / GLYCOLAX ) 17 g packet Take 17 g by mouth 2 (two) times daily. (Patient taking differently: Take 17 g by mouth daily as needed (constipation.).) 14 each 0   prednisoLONE acetate (PRED FORTE) 1 % ophthalmic suspension Place 1 drop into the right eye 3 (three) times daily.     saccharomyces boulardii (FLORASTOR) 250 MG capsule Take 250 mg by mouth in the morning.     sodium chloride  (MURO 128) 2 % ophthalmic solution Place 1 drop into the left eye 3 (three) times daily as needed for eye irritation.     TURMERIC EXTRA STRENGTH PO Take 1 capsule by mouth in the morning and at bedtime.     valACYclovir (VALTREX) 1000 MG tablet Take 500 mg by mouth daily.     zinc sulfate, 50mg  elemental zinc, 220 (50 Zn) MG capsule Take 220 mg by mouth in the morning.     No current facility-administered medications for this encounter.    Physical Findings: In general this is a well appearing African American male in no acute distress. He's alert and oriented x4 and appropriate throughout the examination. Cardiopulmonary assessment is negative for acute distress and he exhibits normal effort.   Lab Findings: Lab Results  Component Value Date   WBC 5.5 06/20/2024   HGB 12.8 (L) 06/20/2024   HCT 38.8 (L) 06/20/2024   MCV 91.9 06/20/2024   PLT 188 06/20/2024    Radiographic Findings:  Patient underwent CT imaging in our clinic for post implant dosimetry. The CT will be fused with his prostate MRI that will be performed following our visit today, today at 12 noon, and will be reviewed by Dr. Patrcia to confirm there is  an adequate distribution of radioactive seeds throughout the prostate gland and ensure that there are no seeds in or near the rectum. We suspect the final radiation plan and dosimetry will show appropriate coverage of the prostate gland. He understands that we will call and inform him of any unexpected findings on further review of his imaging and dosimetry.  Impression/Plan: 78 y.o. gentleman with Stage T1c adenocarcinoma of the prostate with Gleason score of 4+3, and PSA of 5.66.  The patient is recovering from the effects of radiation. His urinary  symptoms should gradually improve over the next 4-6 months. We talked about this today. He is encouraged by his improvement already and is otherwise pleased with his outcome. We also talked about long-term follow-up for prostate cancer following seed implant. He understands that ongoing PSA determinations and digital rectal exams will help perform surveillance to rule out disease recurrence. He saw Ubaldo Eagles, NP for a post-treatment visit on 07/14/24 and is scheduled for his initial post-treatment PSA lab on 09/19/24, prior to a follow up visit with his urologist, Dr. Selma, the following week. He understands what to expect with his PSA measures. Patient was also educated today about some of the long-term effects from radiation including a small risk for rectal bleeding and possibly erectile dysfunction. We talked about some of the general management approaches to these potential complications. However, I did encourage the patient to contact our office or return at any point if he has questions or concerns related to his previous radiation and prostate cancer.    Sabra MICAEL Rusk, PA-C

## 2024-07-19 NOTE — Progress Notes (Signed)
  Radiation Oncology         [phone] ________________________________  Name: Arthur Spencer MRN: [id number]  Date: 07/19/2024  DOB: 1946/06/10  COMPLEX SIMULATION NOTE  NARRATIVE:  The patient was brought to the CT Simulation planning suite today following prostate seed implantation approximately one month ago.  Identity was confirmed.  All relevant records and images related to the planned course of therapy were reviewed.  Then, the patient was set-up supine.  CT images were obtained.  The CT images were loaded into the planning software.  Then the prostate and rectum were contoured.  Treatment planning then occurred.  The implanted iodine 125 seeds were identified by the physics staff for projection of radiation distribution  I have requested : 3D Simulation  I have requested a DVH of the following structures: Prostate and rectum.    ________________________________  Donnice FELIX Patrcia, M.D.

## 2024-08-09 ENCOUNTER — Encounter: Payer: Self-pay | Admitting: Radiation Oncology

## 2024-08-09 DIAGNOSIS — C61 Malignant neoplasm of prostate: Secondary | ICD-10-CM | POA: Diagnosis not present

## 2024-08-09 NOTE — Progress Notes (Signed)
  Radiation Oncology         [phone] ________________________________  Name: Arthur Spencer MRN: [id number]  Date: 08/09/2024  DOB: 04-23-1946  3D Planning Note   Prostate Brachytherapy Post-Implant Dosimetry  Diagnosis: 78 y.o. gentleman with Stage T1c adenocarcinoma of the prostate with Gleason score of 4+3, and PSA of 5.66.   Narrative: On a previous date, Abigail Marsiglia returned following prostate seed implantation for post implant planning. He underwent CT scan complex simulation to delineate the three-dimensional structures of the pelvis and demonstrate the radiation distribution.  Since that time, the seed localization, and complex isodose planning with dose volume histograms have now been completed.  Results:   Prostate Coverage - The dose of radiation delivered to the 90% or more of the prostate gland (D90) was 115.2% of the prescription dose. This exceeds our goal of greater than 90%. Rectal Sparing - The volume of rectal tissue receiving the prescription dose or higher was 0.0 cc. This falls under our thresholds tolerance of 1.0 cc.  Impression: The prostate seed implant appears to show adequate target coverage and appropriate rectal sparing.  Plan:  The patient will continue to follow with urology for ongoing PSA determinations. I would anticipate a high likelihood for local tumor control with minimal risk for rectal morbidity.  ________________________________  Donnice FELIX Patrcia, M.D.

## 2024-08-09 NOTE — Radiation Completion Notes (Signed)
 Patient Name: Arthur Spencer, Arthur Spencer MRN: [id number] Date of Birth: 08/07/1946 Referring Physician: DONNICE SIAD, M.D. Date of Service: 2024-08-09 Radiation Oncologist: Adina Barge, M.D. [facility]                             RADIATION ONCOLOGY END OF TREATMENT NOTE     Diagnosis: C61 Malignant neoplasm of prostate Staging on 2024-03-07: Malignant neoplasm of prostate (HCC) T=cT1c, N=cN0, M=cM0 Intent: Curative     ==========DELIVERED PLANS==========  Prostate Seed Implant Date: 2024-06-30   Plan Name: Prostate Seed Implant Site: Prostate Technique: Radioactive Seed Implant I-125 Mode: Brachytherapy Dose Per Fraction: 145 Gy Prescribed Dose (Delivered / Prescribed): 145 Gy / 145 Gy Prescribed Fxs (Delivered / Prescribed): 1 / 1     ==========ON TREATMENT VISIT DATES========== 2024-06-30     ==========UPCOMING VISITS==========

## 2024-10-06 ENCOUNTER — Encounter: Payer: Self-pay | Admitting: *Deleted

## 2024-10-16 ENCOUNTER — Encounter: Payer: Self-pay | Admitting: *Deleted

## 2024-10-24 ENCOUNTER — Encounter: Payer: Self-pay | Admitting: *Deleted

## 2024-10-24 ENCOUNTER — Inpatient Hospital Stay: Attending: Adult Health | Admitting: *Deleted

## 2024-10-24 DIAGNOSIS — C61 Malignant neoplasm of prostate: Secondary | ICD-10-CM

## 2024-10-24 NOTE — Progress Notes (Signed)
SCP reviewed and completed. 

## 2024-11-20 ENCOUNTER — Encounter: Payer: Self-pay | Admitting: *Deleted

## 2024-11-24 ENCOUNTER — Other Ambulatory Visit: Payer: Self-pay | Admitting: Internal Medicine

## 2024-11-24 DIAGNOSIS — B9689 Other specified bacterial agents as the cause of diseases classified elsewhere: Secondary | ICD-10-CM

## 2024-11-24 DIAGNOSIS — J019 Acute sinusitis, unspecified: Secondary | ICD-10-CM

## 2024-11-28 ENCOUNTER — Inpatient Hospital Stay: Admission: RE | Admit: 2024-11-28

## 2024-11-28 ENCOUNTER — Ambulatory Visit
Admission: RE | Admit: 2024-11-28 | Discharge: 2024-11-28 | Disposition: A | Source: Ambulatory Visit | Attending: Internal Medicine | Admitting: Internal Medicine

## 2024-11-28 DIAGNOSIS — J019 Acute sinusitis, unspecified: Secondary | ICD-10-CM

## 2024-11-28 DIAGNOSIS — B9689 Other specified bacterial agents as the cause of diseases classified elsewhere: Secondary | ICD-10-CM

## 2024-11-28 MED ORDER — IOPAMIDOL (ISOVUE-300) INJECTION 61%
75.0000 mL | Freq: Once | INTRAVENOUS | Status: AC | PRN
  Administered 2024-11-28: 15:00:00 75 mL via INTRAVENOUS
